# Patient Record
Sex: Female | Born: 1940 | Race: White | Hispanic: No | Marital: Married | Smoking: Former smoker
Health system: Southern US, Community
[De-identification: ages and names within clinical notes are randomized; demographics above are authoritative.]

## PROBLEM LIST (undated history)

## (undated) DIAGNOSIS — I10 Essential (primary) hypertension: Secondary | ICD-10-CM

## (undated) DIAGNOSIS — M8580 Other specified disorders of bone density and structure, unspecified site: Secondary | ICD-10-CM

## (undated) DIAGNOSIS — H25019 Cortical age-related cataract, unspecified eye: Secondary | ICD-10-CM

## (undated) DIAGNOSIS — K635 Polyp of colon: Secondary | ICD-10-CM

## (undated) DIAGNOSIS — Z972 Presence of dental prosthetic device (complete) (partial): Secondary | ICD-10-CM

## (undated) DIAGNOSIS — M1990 Unspecified osteoarthritis, unspecified site: Secondary | ICD-10-CM

## (undated) DIAGNOSIS — K219 Gastro-esophageal reflux disease without esophagitis: Secondary | ICD-10-CM

## (undated) DIAGNOSIS — E119 Type 2 diabetes mellitus without complications: Secondary | ICD-10-CM

## (undated) DIAGNOSIS — E785 Hyperlipidemia, unspecified: Secondary | ICD-10-CM

## (undated) DIAGNOSIS — C801 Malignant (primary) neoplasm, unspecified: Secondary | ICD-10-CM

## (undated) DIAGNOSIS — R7303 Prediabetes: Secondary | ICD-10-CM

## (undated) DIAGNOSIS — E039 Hypothyroidism, unspecified: Secondary | ICD-10-CM

## (undated) DIAGNOSIS — R011 Cardiac murmur, unspecified: Secondary | ICD-10-CM

## (undated) HISTORY — PX: COLONOSCOPY: SHX174

## (undated) HISTORY — PX: ABDOMINAL HYSTERECTOMY: SHX81

## (undated) HISTORY — PX: BREAST CYST ASPIRATION: SHX578

## (undated) HISTORY — PX: CRANIOPLASTY: SHX1407

---

## 2005-08-30 ENCOUNTER — Ambulatory Visit: Payer: Self-pay

## 2007-05-29 ENCOUNTER — Ambulatory Visit: Payer: Self-pay

## 2008-05-31 ENCOUNTER — Ambulatory Visit: Payer: Self-pay

## 2009-05-04 ENCOUNTER — Ambulatory Visit: Payer: Self-pay | Admitting: Family Medicine

## 2009-06-06 ENCOUNTER — Ambulatory Visit: Payer: Self-pay | Admitting: Obstetrics and Gynecology

## 2010-06-12 ENCOUNTER — Ambulatory Visit: Payer: Self-pay

## 2010-06-16 ENCOUNTER — Ambulatory Visit: Payer: Self-pay

## 2010-08-11 ENCOUNTER — Ambulatory Visit: Payer: Self-pay | Admitting: Internal Medicine

## 2010-08-23 ENCOUNTER — Ambulatory Visit: Payer: Self-pay | Admitting: Ophthalmology

## 2010-10-30 ENCOUNTER — Ambulatory Visit: Payer: Self-pay | Admitting: Gastroenterology

## 2010-10-30 HISTORY — PX: COLONOSCOPY: SHX174

## 2010-11-01 LAB — PATHOLOGY REPORT

## 2010-12-24 HISTORY — PX: CATARACT EXTRACTION: SUR2

## 2011-06-20 ENCOUNTER — Ambulatory Visit: Payer: Self-pay

## 2012-06-25 ENCOUNTER — Ambulatory Visit: Payer: Self-pay

## 2013-07-20 ENCOUNTER — Ambulatory Visit: Payer: Self-pay | Admitting: Obstetrics and Gynecology

## 2013-07-28 ENCOUNTER — Ambulatory Visit: Payer: Self-pay | Admitting: Obstetrics and Gynecology

## 2014-02-16 ENCOUNTER — Ambulatory Visit: Payer: Self-pay | Admitting: Surgery

## 2014-04-30 DIAGNOSIS — E039 Hypothyroidism, unspecified: Secondary | ICD-10-CM | POA: Insufficient documentation

## 2014-04-30 DIAGNOSIS — E7800 Pure hypercholesterolemia, unspecified: Secondary | ICD-10-CM | POA: Insufficient documentation

## 2014-04-30 DIAGNOSIS — E119 Type 2 diabetes mellitus without complications: Secondary | ICD-10-CM | POA: Insufficient documentation

## 2014-04-30 DIAGNOSIS — M8580 Other specified disorders of bone density and structure, unspecified site: Secondary | ICD-10-CM | POA: Insufficient documentation

## 2014-07-27 ENCOUNTER — Ambulatory Visit: Payer: Self-pay

## 2014-12-29 DIAGNOSIS — H2012 Chronic iridocyclitis, left eye: Secondary | ICD-10-CM | POA: Diagnosis not present

## 2015-03-16 DIAGNOSIS — M25511 Pain in right shoulder: Secondary | ICD-10-CM | POA: Diagnosis not present

## 2015-06-21 DIAGNOSIS — Z1231 Encounter for screening mammogram for malignant neoplasm of breast: Secondary | ICD-10-CM | POA: Diagnosis not present

## 2015-06-21 DIAGNOSIS — Z01411 Encounter for gynecological examination (general) (routine) with abnormal findings: Secondary | ICD-10-CM | POA: Diagnosis not present

## 2015-06-22 ENCOUNTER — Other Ambulatory Visit: Payer: Self-pay | Admitting: Obstetrics and Gynecology

## 2015-06-22 DIAGNOSIS — R921 Mammographic calcification found on diagnostic imaging of breast: Secondary | ICD-10-CM

## 2015-06-28 DIAGNOSIS — E780 Pure hypercholesterolemia: Secondary | ICD-10-CM | POA: Diagnosis not present

## 2015-06-28 DIAGNOSIS — E119 Type 2 diabetes mellitus without complications: Secondary | ICD-10-CM | POA: Diagnosis not present

## 2015-06-28 DIAGNOSIS — Z23 Encounter for immunization: Secondary | ICD-10-CM | POA: Diagnosis not present

## 2015-06-28 DIAGNOSIS — E039 Hypothyroidism, unspecified: Secondary | ICD-10-CM | POA: Diagnosis not present

## 2015-07-19 DIAGNOSIS — Z780 Asymptomatic menopausal state: Secondary | ICD-10-CM | POA: Diagnosis not present

## 2015-07-29 ENCOUNTER — Ambulatory Visit
Admission: RE | Admit: 2015-07-29 | Discharge: 2015-07-29 | Disposition: A | Discharge status: Home / Self Care | Payer: Commercial Managed Care - HMO | Source: Ambulatory Visit | Attending: Obstetrics and Gynecology | Admitting: Obstetrics and Gynecology

## 2015-07-29 ENCOUNTER — Ambulatory Visit: Payer: Medicare HMO

## 2015-07-29 DIAGNOSIS — R921 Mammographic calcification found on diagnostic imaging of breast: Secondary | ICD-10-CM | POA: Insufficient documentation

## 2015-08-19 DIAGNOSIS — R202 Paresthesia of skin: Secondary | ICD-10-CM | POA: Diagnosis not present

## 2015-08-19 DIAGNOSIS — L814 Other melanin hyperpigmentation: Secondary | ICD-10-CM | POA: Diagnosis not present

## 2015-08-19 DIAGNOSIS — D1801 Hemangioma of skin and subcutaneous tissue: Secondary | ICD-10-CM | POA: Diagnosis not present

## 2015-11-02 DIAGNOSIS — Z23 Encounter for immunization: Secondary | ICD-10-CM | POA: Diagnosis not present

## 2015-11-23 DIAGNOSIS — G8929 Other chronic pain: Secondary | ICD-10-CM | POA: Diagnosis not present

## 2015-11-23 DIAGNOSIS — M25511 Pain in right shoulder: Secondary | ICD-10-CM | POA: Diagnosis not present

## 2015-11-30 DIAGNOSIS — M7581 Other shoulder lesions, right shoulder: Secondary | ICD-10-CM | POA: Diagnosis not present

## 2015-12-29 DIAGNOSIS — E7800 Pure hypercholesterolemia, unspecified: Secondary | ICD-10-CM | POA: Diagnosis not present

## 2015-12-29 DIAGNOSIS — E119 Type 2 diabetes mellitus without complications: Secondary | ICD-10-CM | POA: Diagnosis not present

## 2015-12-29 DIAGNOSIS — E039 Hypothyroidism, unspecified: Secondary | ICD-10-CM | POA: Diagnosis not present

## 2016-01-17 ENCOUNTER — Other Ambulatory Visit: Payer: Self-pay | Admitting: Orthopedic Surgery

## 2016-01-17 DIAGNOSIS — G8929 Other chronic pain: Secondary | ICD-10-CM | POA: Diagnosis not present

## 2016-01-17 DIAGNOSIS — M25511 Pain in right shoulder: Principal | ICD-10-CM

## 2016-01-17 DIAGNOSIS — M67911 Unspecified disorder of synovium and tendon, right shoulder: Secondary | ICD-10-CM | POA: Diagnosis not present

## 2016-01-19 ENCOUNTER — Ambulatory Visit
Admission: RE | Admit: 2016-01-19 | Discharge: 2016-01-19 | Disposition: A | Discharge status: Home / Self Care | Payer: Commercial Managed Care - HMO | Source: Ambulatory Visit | Attending: Orthopedic Surgery | Admitting: Orthopedic Surgery

## 2016-01-19 DIAGNOSIS — M19011 Primary osteoarthritis, right shoulder: Secondary | ICD-10-CM | POA: Diagnosis not present

## 2016-01-19 DIAGNOSIS — M75121 Complete rotator cuff tear or rupture of right shoulder, not specified as traumatic: Secondary | ICD-10-CM | POA: Diagnosis not present

## 2016-01-19 DIAGNOSIS — M25511 Pain in right shoulder: Secondary | ICD-10-CM | POA: Diagnosis not present

## 2016-01-19 DIAGNOSIS — M67911 Unspecified disorder of synovium and tendon, right shoulder: Secondary | ICD-10-CM | POA: Diagnosis not present

## 2016-01-19 DIAGNOSIS — G8929 Other chronic pain: Secondary | ICD-10-CM | POA: Insufficient documentation

## 2016-01-19 DIAGNOSIS — S43431A Superior glenoid labrum lesion of right shoulder, initial encounter: Secondary | ICD-10-CM | POA: Insufficient documentation

## 2016-01-23 DIAGNOSIS — M25611 Stiffness of right shoulder, not elsewhere classified: Secondary | ICD-10-CM | POA: Diagnosis not present

## 2016-01-23 DIAGNOSIS — G8929 Other chronic pain: Secondary | ICD-10-CM | POA: Diagnosis not present

## 2016-01-23 DIAGNOSIS — M6281 Muscle weakness (generalized): Secondary | ICD-10-CM | POA: Diagnosis not present

## 2016-01-23 DIAGNOSIS — M25511 Pain in right shoulder: Secondary | ICD-10-CM | POA: Diagnosis not present

## 2016-02-08 DIAGNOSIS — M6281 Muscle weakness (generalized): Secondary | ICD-10-CM | POA: Diagnosis not present

## 2016-02-08 DIAGNOSIS — G8929 Other chronic pain: Secondary | ICD-10-CM | POA: Diagnosis not present

## 2016-02-08 DIAGNOSIS — M25511 Pain in right shoulder: Secondary | ICD-10-CM | POA: Diagnosis not present

## 2016-02-08 DIAGNOSIS — M25611 Stiffness of right shoulder, not elsewhere classified: Secondary | ICD-10-CM | POA: Diagnosis not present

## 2016-02-10 DIAGNOSIS — M6281 Muscle weakness (generalized): Secondary | ICD-10-CM | POA: Diagnosis not present

## 2016-02-10 DIAGNOSIS — G8929 Other chronic pain: Secondary | ICD-10-CM | POA: Diagnosis not present

## 2016-02-10 DIAGNOSIS — M25611 Stiffness of right shoulder, not elsewhere classified: Secondary | ICD-10-CM | POA: Diagnosis not present

## 2016-02-10 DIAGNOSIS — M25511 Pain in right shoulder: Secondary | ICD-10-CM | POA: Diagnosis not present

## 2016-02-13 DIAGNOSIS — M25511 Pain in right shoulder: Secondary | ICD-10-CM | POA: Diagnosis not present

## 2016-02-13 DIAGNOSIS — M25611 Stiffness of right shoulder, not elsewhere classified: Secondary | ICD-10-CM | POA: Diagnosis not present

## 2016-02-13 DIAGNOSIS — G8929 Other chronic pain: Secondary | ICD-10-CM | POA: Diagnosis not present

## 2016-02-13 DIAGNOSIS — M6281 Muscle weakness (generalized): Secondary | ICD-10-CM | POA: Diagnosis not present

## 2016-02-15 DIAGNOSIS — M75121 Complete rotator cuff tear or rupture of right shoulder, not specified as traumatic: Secondary | ICD-10-CM | POA: Diagnosis not present

## 2016-02-15 DIAGNOSIS — M7581 Other shoulder lesions, right shoulder: Secondary | ICD-10-CM | POA: Diagnosis not present

## 2016-02-15 DIAGNOSIS — S4991XD Unspecified injury of right shoulder and upper arm, subsequent encounter: Secondary | ICD-10-CM | POA: Diagnosis not present

## 2016-02-17 ENCOUNTER — Encounter: Payer: Self-pay | Admitting: *Deleted

## 2016-02-22 ENCOUNTER — Encounter: Payer: Self-pay | Admitting: Anesthesiology

## 2016-02-22 ENCOUNTER — Ambulatory Visit
Admission: RE | Admit: 2016-02-22 | Discharge: 2016-02-22 | Disposition: A | Discharge status: Home / Self Care | Payer: Commercial Managed Care - HMO | Source: Ambulatory Visit | Attending: Surgery | Admitting: Surgery

## 2016-02-22 ENCOUNTER — Ambulatory Visit: Payer: Commercial Managed Care - HMO | Admitting: Anesthesiology

## 2016-02-22 ENCOUNTER — Encounter
Admission: RE | Disposition: A | Discharge status: Home / Self Care | Payer: Self-pay | Source: Ambulatory Visit | Attending: Surgery

## 2016-02-22 DIAGNOSIS — Y9389 Activity, other specified: Secondary | ICD-10-CM | POA: Insufficient documentation

## 2016-02-22 DIAGNOSIS — M19011 Primary osteoarthritis, right shoulder: Secondary | ICD-10-CM | POA: Insufficient documentation

## 2016-02-22 DIAGNOSIS — E039 Hypothyroidism, unspecified: Secondary | ICD-10-CM | POA: Insufficient documentation

## 2016-02-22 DIAGNOSIS — S43431A Superior glenoid labrum lesion of right shoulder, initial encounter: Secondary | ICD-10-CM | POA: Insufficient documentation

## 2016-02-22 DIAGNOSIS — X58XXXA Exposure to other specified factors, initial encounter: Secondary | ICD-10-CM | POA: Insufficient documentation

## 2016-02-22 DIAGNOSIS — E785 Hyperlipidemia, unspecified: Secondary | ICD-10-CM | POA: Diagnosis not present

## 2016-02-22 DIAGNOSIS — E119 Type 2 diabetes mellitus without complications: Secondary | ICD-10-CM | POA: Diagnosis not present

## 2016-02-22 DIAGNOSIS — Z8249 Family history of ischemic heart disease and other diseases of the circulatory system: Secondary | ICD-10-CM | POA: Insufficient documentation

## 2016-02-22 DIAGNOSIS — M8580 Other specified disorders of bone density and structure, unspecified site: Secondary | ICD-10-CM | POA: Insufficient documentation

## 2016-02-22 DIAGNOSIS — M75121 Complete rotator cuff tear or rupture of right shoulder, not specified as traumatic: Secondary | ICD-10-CM | POA: Diagnosis not present

## 2016-02-22 DIAGNOSIS — Z79899 Other long term (current) drug therapy: Secondary | ICD-10-CM | POA: Insufficient documentation

## 2016-02-22 DIAGNOSIS — Z833 Family history of diabetes mellitus: Secondary | ICD-10-CM | POA: Diagnosis not present

## 2016-02-22 DIAGNOSIS — Z90710 Acquired absence of both cervix and uterus: Secondary | ICD-10-CM | POA: Diagnosis not present

## 2016-02-22 DIAGNOSIS — M7581 Other shoulder lesions, right shoulder: Secondary | ICD-10-CM | POA: Diagnosis not present

## 2016-02-22 DIAGNOSIS — S43439A Superior glenoid labrum lesion of unspecified shoulder, initial encounter: Secondary | ICD-10-CM | POA: Diagnosis present

## 2016-02-22 DIAGNOSIS — M24111 Other articular cartilage disorders, right shoulder: Secondary | ICD-10-CM | POA: Diagnosis not present

## 2016-02-22 DIAGNOSIS — M7591 Shoulder lesion, unspecified, right shoulder: Secondary | ICD-10-CM | POA: Diagnosis not present

## 2016-02-22 DIAGNOSIS — Y929 Unspecified place or not applicable: Secondary | ICD-10-CM | POA: Insufficient documentation

## 2016-02-22 DIAGNOSIS — Z811 Family history of alcohol abuse and dependence: Secondary | ICD-10-CM | POA: Diagnosis not present

## 2016-02-22 DIAGNOSIS — Z87891 Personal history of nicotine dependence: Secondary | ICD-10-CM | POA: Insufficient documentation

## 2016-02-22 DIAGNOSIS — M7521 Bicipital tendinitis, right shoulder: Secondary | ICD-10-CM | POA: Diagnosis not present

## 2016-02-22 DIAGNOSIS — M7541 Impingement syndrome of right shoulder: Secondary | ICD-10-CM | POA: Insufficient documentation

## 2016-02-22 HISTORY — DX: Prediabetes: R73.03

## 2016-02-22 HISTORY — DX: Presence of dental prosthetic device (complete) (partial): Z97.2

## 2016-02-22 HISTORY — DX: Other specified disorders of bone density and structure, unspecified site: M85.80

## 2016-02-22 HISTORY — PX: SHOULDER ARTHROSCOPY: SHX128

## 2016-02-22 HISTORY — DX: Hyperlipidemia, unspecified: E78.5

## 2016-02-22 HISTORY — DX: Hypothyroidism, unspecified: E03.9

## 2016-02-22 HISTORY — DX: Cardiac murmur, unspecified: R01.1

## 2016-02-22 SURGERY — ARTHROSCOPY, SHOULDER
Anesthesia: Regional | Site: Shoulder | Laterality: Right | Wound class: Clean

## 2016-02-22 MED ORDER — POTASSIUM CHLORIDE IN NACL 20-0.9 MEQ/L-% IV SOLN: INTRAVENOUS | Status: DC

## 2016-02-22 MED ORDER — METOCLOPRAMIDE HCL 5 MG/ML IJ SOLN: 5.0000 mg | Freq: Three times a day (TID) | INTRAMUSCULAR | Status: DC | PRN

## 2016-02-22 MED ORDER — LACTATED RINGERS IV SOLN
INTRAVENOUS | Status: DC | PRN
  Administered 2016-02-22: 13:00:00

## 2016-02-22 MED ORDER — LIDOCAINE HCL (CARDIAC) 20 MG/ML IV SOLN
INTRAVENOUS | Status: DC | PRN
  Administered 2016-02-22: 50 mg via INTRATRACHEAL

## 2016-02-22 MED ORDER — DEXAMETHASONE SODIUM PHOSPHATE 4 MG/ML IJ SOLN
INTRAMUSCULAR | Status: DC | PRN
  Administered 2016-02-22: 8 mg via INTRAVENOUS

## 2016-02-22 MED ORDER — ONDANSETRON HCL 4 MG PO TABS: 4.0000 mg | ORAL_TABLET | Freq: Four times a day (QID) | ORAL | Status: DC | PRN

## 2016-02-22 MED ORDER — BUPIVACAINE-EPINEPHRINE (PF) 0.5% -1:200000 IJ SOLN
INTRAMUSCULAR | Status: DC | PRN
  Administered 2016-02-22: 21 mL

## 2016-02-22 MED ORDER — OXYCODONE HCL 5 MG PO TABS: 5.0000 mg | ORAL_TABLET | ORAL | Status: DC | PRN

## 2016-02-22 MED ORDER — OXYCODONE HCL 5 MG PO TABS: 5.0000 mg | ORAL_TABLET | Freq: Once | ORAL | Status: DC | PRN

## 2016-02-22 MED ORDER — PROMETHAZINE HCL 25 MG/ML IJ SOLN: 6.2500 mg | INTRAMUSCULAR | Status: DC | PRN

## 2016-02-22 MED ORDER — ACETAMINOPHEN 325 MG PO TABS
650.0000 mg | ORAL_TABLET | Freq: Once | ORAL | Status: AC | PRN
  Administered 2016-02-22: 650 mg via ORAL

## 2016-02-22 MED ORDER — ONDANSETRON HCL 4 MG/2ML IJ SOLN
INTRAMUSCULAR | Status: DC | PRN
Start: 2016-02-22 — End: 2016-02-22

## 2016-02-22 MED ORDER — FENTANYL CITRATE (PF) 100 MCG/2ML IJ SOLN: 25.0000 ug | INTRAMUSCULAR | Status: DC | PRN

## 2016-02-22 MED ORDER — MIDAZOLAM HCL 2 MG/2ML IJ SOLN
INTRAMUSCULAR | Status: DC | PRN
  Administered 2016-02-22: 2 mg via INTRAVENOUS

## 2016-02-22 MED ORDER — ONDANSETRON HCL 4 MG/2ML IJ SOLN
INTRAMUSCULAR | Status: DC | PRN
  Administered 2016-02-22: 4 mg via INTRAVENOUS

## 2016-02-22 MED ORDER — LACTATED RINGERS IV SOLN
INTRAVENOUS | Status: DC
  Administered 2016-02-22: 10:00:00 via INTRAVENOUS

## 2016-02-22 MED ORDER — METOCLOPRAMIDE HCL 5 MG PO TABS: 5.0000 mg | ORAL_TABLET | Freq: Three times a day (TID) | ORAL | Status: DC | PRN

## 2016-02-22 MED ORDER — CEFAZOLIN SODIUM-DEXTROSE 2-3 GM-% IV SOLR
2.0000 g | Freq: Once | INTRAVENOUS | Status: AC
  Administered 2016-02-22: 2 g via INTRAVENOUS

## 2016-02-22 MED ORDER — FENTANYL CITRATE (PF) 100 MCG/2ML IJ SOLN
INTRAMUSCULAR | Status: DC | PRN
  Administered 2016-02-22 (×3): 50 ug via INTRAVENOUS

## 2016-02-22 MED ORDER — OXYCODONE HCL 5 MG/5ML PO SOLN: 5.0000 mg | Freq: Once | ORAL | Status: DC | PRN

## 2016-02-22 MED ORDER — PROPOFOL 10 MG/ML IV BOLUS
INTRAVENOUS | Status: DC | PRN
  Administered 2016-02-22: 150 mg via INTRAVENOUS

## 2016-02-22 MED ORDER — ONDANSETRON HCL 4 MG/2ML IJ SOLN: 4.0000 mg | Freq: Four times a day (QID) | INTRAMUSCULAR | Status: DC | PRN

## 2016-02-22 MED ORDER — ROPIVACAINE HCL 5 MG/ML IJ SOLN
INTRAMUSCULAR | Status: DC | PRN
  Administered 2016-02-22: 200 mg via PERINEURAL

## 2016-02-22 SURGICAL SUPPLY — 37 items
ANCHOR JUGGERKNOT WTAP NDL 2.9 (Anchor) ×4 IMPLANT
ANCHOR SUT W/ ORTHOCORD (Anchor) ×2 IMPLANT
BIT DRILL JUGRKNT W/NDL BIT2.9 (DRILL) ×1 IMPLANT
BLADE FULL RADIUS 3.5 (BLADE) ×2 IMPLANT
BUR ACROMIONIZER 4.0 (BURR) ×2 IMPLANT
CHLORAPREP W/TINT 26ML (MISCELLANEOUS) ×4 IMPLANT
COVER LIGHT HANDLE UNIVERSAL (MISCELLANEOUS) ×4 IMPLANT
COVER MAYO STAND STRL (DRAPES) ×2 IMPLANT
DRAPE IMP U-DRAPE 54X76 (DRAPES) ×4 IMPLANT
DRILL JUGGERKNOT W/NDL BIT 2.9 (DRILL) ×2
GAUZE PETRO XEROFOAM 1X8 (MISCELLANEOUS) ×2 IMPLANT
GAUZE SPONGE 4X4 12PLY STRL (GAUZE/BANDAGES/DRESSINGS) ×2 IMPLANT
GLOVE BIO SURGEON STRL SZ8 (GLOVE) ×4 IMPLANT
GLOVE INDICATOR 8.0 STRL GRN (GLOVE) ×2 IMPLANT
GOWN STRL REUS W/ TWL LRG LVL3 (GOWN DISPOSABLE) ×1 IMPLANT
GOWN STRL REUS W/ TWL XL LVL3 (GOWN DISPOSABLE) ×1 IMPLANT
GOWN STRL REUS W/TWL LRG LVL3 (GOWN DISPOSABLE) ×1
GOWN STRL REUS W/TWL XL LVL3 (GOWN DISPOSABLE) ×1
IMPL ANCHOR PEEK 4.5MM (Anchor) ×1 IMPLANT
IMPLANT ANCHOR PEEK 4.5MM (Anchor) ×2 IMPLANT
IV LACTATED RINGER IRRG 3000ML (IV SOLUTION) ×2
IV LR IRRIG 3000ML ARTHROMATIC (IV SOLUTION) ×2 IMPLANT
KIT CANNULA 8X76-LX IN CANNULA (CANNULA) ×2 IMPLANT
KIT ROOM TURNOVER OR (KITS) ×2 IMPLANT
MANIFOLD 4PT FOR NEPTUNE1 (MISCELLANEOUS) ×2 IMPLANT
MAT BLUE FLOOR 46X72 FLO (MISCELLANEOUS) ×2 IMPLANT
NEEDLE HYPO 21X1.5 SAFETY (NEEDLE) ×2 IMPLANT
PACK ARTHROSCOPY SHOULDER (MISCELLANEOUS) ×2 IMPLANT
PAD GROUND ADULT SPLIT (MISCELLANEOUS) IMPLANT
STAPLER SKIN PROX 35W (STAPLE) ×2 IMPLANT
STRAP BODY AND KNEE 60X3 (MISCELLANEOUS) ×4 IMPLANT
SUT ETHIBOND 0 MO6 C/R (SUTURE) ×2 IMPLANT
SUT VIC AB 2-0 CT1 27 (SUTURE) ×2
SUT VIC AB 2-0 CT1 TAPERPNT 27 (SUTURE) ×2 IMPLANT
TAPE MICROFOAM 4IN (TAPE) ×2 IMPLANT
TUBING ARTHRO INFLOW-ONLY STRL (TUBING) ×2 IMPLANT
WAND HAND CNTRL MULTIVAC 90 (MISCELLANEOUS) ×2 IMPLANT

## 2016-02-22 NOTE — Transfer of Care (Signed)
Immediate Anesthesia Transfer of Care Note  Patient: Gloria Eaton  Procedure(s) Performed: Procedure(s): ARTHROSCOPY SHOULDER WITH DEBRIDEMENT, DECOMPRESSION, POSSIBLE SLAP REPAIR, ROTATOR CUFF REPAIR AND BICEPS TENODESIS (Right)  Patient Location: PACU  Anesthesia Type: Regional, General LMA  Level of Consciousness: awake, alert  and patient cooperative  Airway and Oxygen Therapy: Patient Spontanous Breathing and Patient connected to supplemental oxygen  Post-op Assessment: Post-op Vital signs reviewed, Patient's Cardiovascular Status Stable, Respiratory Function Stable, Patent Airway and No signs of Nausea or vomiting  Post-op Vital Signs: Reviewed and stable  Complications: No apparent anesthesia complications

## 2016-02-22 NOTE — H&P (Signed)
Paper H&P to be scanned into permanent record. H&P reviewed. No changes. 

## 2016-02-22 NOTE — Anesthesia Postprocedure Evaluation (Signed)
Anesthesia Post Note  Patient: Gloria Eaton  Procedure(s) Performed: Procedure(s) (LRB): Extensive arthroscopic debridement, arthroscopic subscapularis tendon tear, arthroscopic subacromial decompression, mini-open rotator cuff repair, and mini-open biceps tenodesis, right shoulder.  (Right)  Patient location during evaluation: PACU Anesthesia Type: General Level of consciousness: awake and alert Pain management: pain level controlled Vital Signs Assessment: post-procedure vital signs reviewed and stable Respiratory status: spontaneous breathing, nonlabored ventilation, respiratory function stable and patient connected to nasal cannula oxygen Cardiovascular status: blood pressure returned to baseline and stable Postop Assessment: no signs of nausea or vomiting Anesthetic complications: no    Alisa Graff

## 2016-02-22 NOTE — Anesthesia Procedure Notes (Addendum)
Anesthesia Regional Block:  Interscalene brachial plexus block  Pre-Anesthetic Checklist: ,, timeout performed, Correct Patient, Correct Site, Correct Laterality, Correct Procedure, Correct Position, site marked, Risks and benefits discussed,  Surgical consent,  Pre-op evaluation,  At surgeon's request and post-op pain management   Prep: chloraprep       Needles:  Injection technique: Single-shot  Needle Type: Stimiplex     Needle Length: 5cm 5 cm Needle Gauge: 22 and 22 G    Additional Needles:  Procedures: ultrasound guided (picture in chart)  Motor weakness within 5 minutes. Interscalene brachial plexus block Narrative:  Start time: 02/22/2016 9:57 AM End time: 02/22/2016 10:03 AM Injection made incrementally with aspirations every 5 mL.  Performed by: Personally  Anesthesiologist: BEACH, RACHEL B  Additional Notes: Functioning IV was confirmed and monitors applied. Ultrasound guidance: relevant anatomy identified, needle position confirmed, local anesthetic spread visualized around nerve(s)., vascular puncture avoided.  Image printed for medical record.  Negative aspiration and no paresthesias; incremental administration of local anesthetic. The patient tolerated the procedure well. Vitals signes recorded in RN notes.   Procedure Name: LMA Insertion Date/Time: 02/22/2016 11:37 AM Performed by: Londell Moh Pre-anesthesia Checklist: Patient identified, Emergency Drugs available, Suction available, Timeout performed and Patient being monitored Patient Re-evaluated:Patient Re-evaluated prior to inductionOxygen Delivery Method: Circle system utilized Preoxygenation: Pre-oxygenation with 100% oxygen Intubation Type: IV induction LMA: LMA inserted LMA Size: 4.0 Number of attempts: 1 Placement Confirmation: positive ETCO2 and breath sounds checked- equal and bilateral Tube secured with: Tape

## 2016-02-22 NOTE — Op Note (Signed)
02/22/2016  1:40 PM  Patient:   Gloria Eaton  Pre-Op Diagnosis:   Impingement/tendinopathy with rotator cuff tear, degenerative SLAP tear, and biceps tendinopathy, right shoulder.  Postoperative diagnosis: Impingement/tendinopathy with full thickness supraspinatus tendon tear, partial thickness subscapularis tendon tear, Type I degenerative SLAP tear, and biceps tendinopathy, right shoulder.  Procedure: Extensive arthroscopic debridement, arthroscopic subscapularis tendon tear, arthroscopic subacromial decompression, mini-open rotator cuff repair, and mini-open biceps tenodesis, right shoulder.  Anesthesia: General LMA with interscalene block placed preoperatively by the anesthesiologist.  Surgeon:   Pascal Lux, MD  Assistant:   None  Findings: As above. There was a type I degenerative tear of the superior portion of the labrum without frank detachment of the labrum from the glenoid rim. The subscapularis tendon tear involved the insertional fibers of the superior 10% of the tendon, while the supraspinatus tendon tear involved the anterior insertional fibers of the supraspinatus. The articular surfaces of the glenoid and humerus both were in excellent condition.  Complications: None  Fluids:   900 cc  Estimated blood loss: 15 cc  Tourniquet time: None  Drains: None  Closure: Staples   Brief clinical note: The patient is a 75 year old female with a history of right shoulder pain.. The patient's symptoms have progressed despite medications, activity modification, etc. The patient's history and examination are consistent with impingement/tendinopathy with a rotator cuff tear. These findings were confirmed by MRI scan. The patient presents at this time for definitive management of these shoulder symptoms.  Procedure: The patient underwent placement of an interscalene block by the anesthesiologist in the preoperative holding area before she was brought into  the operating room and lain in the supine position. The patient then underwent general laryngeal mask anesthesia before being repositioned in the beach chair position using the beach chair positioner. The right shoulder and upper extremity were prepped with ChloraPrep solution before being draped sterilely. Preoperative antibiotics were administered. A timeout was performed to confirm the proper surgical site before the expected portal sites and incision site were injected with 0.5% Sensorcaine with epinephrine. A posterior portal was created and the glenohumeral joint thoroughly inspected with the findings as described above. An anterior portal was created using an outside-in technique. The labrum and rotator cuff were further probed, again confirming the above-noted findings. Areas of extensive synovitis anteriorly, superiorly, and posteriorly all were debrided back to stable margins using the full-radius resector, as was the degenerative torn portion of the superior labrum. Finally, the torn margins of the supraspinatus and subscapularis tendon tears also were debrided back to stable margins using the full-radius resector. The ArthroCare wand was inserted and used to release the biceps tendon from its labral attachment before contouring the labrum stump. It also was used obtain hemostasis. A separate superolateral portal site was placed using an outside-in technique before the superior portion of the subscapularis tendon was reapproximated using a single Mitek BioKnotless anchor. The repair was noted to be stable with passive external rotation, as well as with probing. The instruments were removed from the joint after suctioning the excess fluid.  The camera was repositioned through the posterior portal into the subacromial space. A separate lateral portal was created using an outside-in technique. The 3.5 mm full-radius resector was introduced and used to perform a subtotal bursectomy. The ArthroCare wand was  then inserted and used to remove the periosteal tissue off the undersurface of the anterior third of the acromion as well as to recess the coracoacromial ligament from its attachment along  the anterior and lateral margins of the acromion. The 4.0 mm acromionizing bur was introduced and used to complete the decompression by removing the undersurface of the anterior third of the acromion. The full radius resector was reintroduced to remove any residual bony debris before the ArthroCare wand was reintroduced to obtain hemostasis. The instruments were then removed from the subacromial space after suctioning the excess fluid.  An approximately 4-5 cm incision was made over the anterolateral aspect of the shoulder beginning at the anterolateral corner of the acromion and extending distally in line with the bicipital groove. This incision was carried down through the subcutaneous tissues to expose the deltoid fascia. The raphae between the anterior and middle thirds was identified and this plane developed to provide access into the subacromial space. Additional bursal tissues were debrided sharply using Metzenbaum scissors. The rotator cuff tear was readily identified. The margins were debrided sharply with a #15 blade and the exposed greater tuberosity roughened with a rongeur. The tear was repaired using a single Biomet 2.9 mm JuggerKnot anchor. Several of these sutures were then brought back laterally and secured using a single Biomet [employer] anchor to create a two-layer closure. A single #0 Ethibond interrupted suture was placed in a side-to-side fashion to optimize the repair. An apparent watertight closure was obtained.  The bicipital groove was identified by palpation and opened for 1-1.5 cm. The biceps tendon stump was retrieved through this defect. The floor of the bicipital groove was roughened with a curet before another Biomet 2.9 mm JuggerKnot anchor was inserted. Both sets of sutures were passed  through the biceps tendon and tied securely to effect the tenodesis. The bicipital sheath was reapproximated using two #0 Ethibond interrupted sutures, incorporating the biceps tendon to further reinforce the tenodesis.  The wound was copiously irrigated with sterile saline solution before the deltoid raphae was reapproximated using 2-0 Vicryl interrupted sutures. The subcutaneous tissues were closed in two layers using 2-0 Vicryl interrupted sutures before the skin was closed using staples. The portal sites also were closed using staples. A sterile bulky dressing was applied to the shoulder before the arm was placed into a shoulder immobilizer. The patient was then awakened, extubated, and returned to the recovery room in satisfactory condition after tolerating the procedure well.

## 2016-02-22 NOTE — Progress Notes (Signed)
[place] ANMD with right, supraclavicular block. Side rails up, monitors on throughout procedure. See vital signs in flow sheet. Tolerated Procedure well.

## 2016-02-22 NOTE — Anesthesia Preprocedure Evaluation (Addendum)
Anesthesia Evaluation  Patient identified by MRN, date of birth, ID band Patient awake    Reviewed: Allergy & Precautions, H&P , NPO status , Patient's Chart, lab work & pertinent test results, reviewed documented beta blocker date and time   Airway Mallampati: II  TM Distance: >3 FB Neck ROM: full    Dental  (+) Upper Dentures, Lower Dentures   Pulmonary neg pulmonary ROS, former smoker,    Pulmonary exam normal breath sounds clear to auscultation       Cardiovascular Exercise Tolerance: Good negative cardio ROS   Rhythm:regular Rate:Normal     Neuro/Psych negative neurological ROS  negative psych ROS   GI/Hepatic negative GI ROS, Neg liver ROS,   Endo/Other  Hypothyroidism   Renal/GU negative Renal ROS  negative genitourinary   Musculoskeletal   Abdominal   Peds  Hematology negative hematology ROS (+)   Anesthesia Other Findings   Reproductive/Obstetrics negative OB ROS                            Anesthesia Physical Anesthesia Plan  ASA: II  Anesthesia Plan: Regional and General LMA   Post-op Pain Management: GA combined w/ Regional for post-op pain   Induction:   Airway Management Planned:   Additional Equipment:   Intra-op Plan:   Post-operative Plan:   Informed Consent: I have reviewed the patients History and Physical, chart, labs and discussed the procedure including the risks, benefits and alternatives for the proposed anesthesia with the patient or authorized representative who has indicated his/her understanding and acceptance.   Dental Advisory Given  Plan Discussed with: CRNA  Anesthesia Plan Comments:        Anesthesia Quick Evaluation

## 2016-02-22 NOTE — Discharge Instructions (Signed)
General Anesthesia, Adult, Care After Refer to this sheet in the next few weeks. These instructions provide you with information on caring for yourself after your procedure. Your health care provider may also give you more specific instructions. Your treatment has been planned according to current medical practices, but problems sometimes occur. Call your health care provider if you have any problems or questions after your procedure. WHAT TO EXPECT AFTER THE PROCEDURE After the procedure, it is typical to experience:  Sleepiness.  Nausea and vomiting. HOME CARE INSTRUCTIONS  For the first 24 hours after general anesthesia:  Have a responsible person with you.  Do not drive a car. If you are alone, do not take public transportation.  Do not drink alcohol.  Do not take medicine that has not been prescribed by your health care provider.  Do not sign important papers or make important decisions.  You may resume a normal diet and activities as directed by your health care provider.  Change bandages (dressings) as directed.  If you have questions or problems that seem related to general anesthesia, call the hospital and ask for the anesthetist or anesthesiologist on call. SEEK MEDICAL CARE IF:  You have nausea and vomiting that continue the day after anesthesia.  You develop a rash. SEEK IMMEDIATE MEDICAL CARE IF:   You have difficulty breathing.  You have chest pain.  You have any allergic problems.   This information is not intended to replace advice given to you by your health care provider. Make sure you discuss any questions you have with your health care provider.   Document Released: 03/18/2001 Document Revised: 12/31/2014 Document Reviewed: 04/09/2012 Elsevier Interactive Patient Education 2016 [employer].  Keep dressing dry and intact.  May shower after dressing changed on post-op day #4 (Sunday).  Cover staples with Band-Aids after drying off. Apply ice  frequently to shoulder. Resume naproxen twice daily with food. Take pain medication as prescribed as needed. May supplement these medications with extra strength Tylenol if necessary. Keep shoulder immobilizer on at all times except may remove for bathing purposes. Follow-up in 10-14 days or as scheduled.

## 2016-02-23 ENCOUNTER — Encounter: Payer: Self-pay | Admitting: Surgery

## 2016-02-28 DIAGNOSIS — M25511 Pain in right shoulder: Secondary | ICD-10-CM | POA: Diagnosis not present

## 2016-02-28 DIAGNOSIS — Z9889 Other specified postprocedural states: Secondary | ICD-10-CM | POA: Diagnosis not present

## 2016-02-28 DIAGNOSIS — M6281 Muscle weakness (generalized): Secondary | ICD-10-CM | POA: Diagnosis not present

## 2016-02-28 DIAGNOSIS — M25611 Stiffness of right shoulder, not elsewhere classified: Secondary | ICD-10-CM | POA: Diagnosis not present

## 2016-03-06 DIAGNOSIS — M25611 Stiffness of right shoulder, not elsewhere classified: Secondary | ICD-10-CM | POA: Diagnosis not present

## 2016-03-06 DIAGNOSIS — Z9889 Other specified postprocedural states: Secondary | ICD-10-CM | POA: Diagnosis not present

## 2016-03-06 DIAGNOSIS — M25511 Pain in right shoulder: Secondary | ICD-10-CM | POA: Diagnosis not present

## 2016-03-08 DIAGNOSIS — M25611 Stiffness of right shoulder, not elsewhere classified: Secondary | ICD-10-CM | POA: Diagnosis not present

## 2016-03-08 DIAGNOSIS — M25511 Pain in right shoulder: Secondary | ICD-10-CM | POA: Diagnosis not present

## 2016-03-12 DIAGNOSIS — M6281 Muscle weakness (generalized): Secondary | ICD-10-CM | POA: Diagnosis not present

## 2016-03-12 DIAGNOSIS — M25511 Pain in right shoulder: Secondary | ICD-10-CM | POA: Diagnosis not present

## 2016-03-14 DIAGNOSIS — M25611 Stiffness of right shoulder, not elsewhere classified: Secondary | ICD-10-CM | POA: Diagnosis not present

## 2016-03-14 DIAGNOSIS — M75121 Complete rotator cuff tear or rupture of right shoulder, not specified as traumatic: Secondary | ICD-10-CM | POA: Diagnosis not present

## 2016-03-14 DIAGNOSIS — M6281 Muscle weakness (generalized): Secondary | ICD-10-CM | POA: Diagnosis not present

## 2016-03-14 DIAGNOSIS — M25511 Pain in right shoulder: Secondary | ICD-10-CM | POA: Diagnosis not present

## 2016-03-19 DIAGNOSIS — M25511 Pain in right shoulder: Secondary | ICD-10-CM | POA: Diagnosis not present

## 2016-03-19 DIAGNOSIS — M25611 Stiffness of right shoulder, not elsewhere classified: Secondary | ICD-10-CM | POA: Diagnosis not present

## 2016-03-19 DIAGNOSIS — Z9889 Other specified postprocedural states: Secondary | ICD-10-CM | POA: Diagnosis not present

## 2016-03-21 DIAGNOSIS — Z9889 Other specified postprocedural states: Secondary | ICD-10-CM | POA: Diagnosis not present

## 2016-03-21 DIAGNOSIS — M25511 Pain in right shoulder: Secondary | ICD-10-CM | POA: Diagnosis not present

## 2016-03-21 DIAGNOSIS — M25611 Stiffness of right shoulder, not elsewhere classified: Secondary | ICD-10-CM | POA: Diagnosis not present

## 2016-04-02 DIAGNOSIS — M25511 Pain in right shoulder: Secondary | ICD-10-CM | POA: Diagnosis not present

## 2016-04-02 DIAGNOSIS — Z9889 Other specified postprocedural states: Secondary | ICD-10-CM | POA: Diagnosis not present

## 2016-04-02 DIAGNOSIS — M25611 Stiffness of right shoulder, not elsewhere classified: Secondary | ICD-10-CM | POA: Diagnosis not present

## 2016-04-02 DIAGNOSIS — M6281 Muscle weakness (generalized): Secondary | ICD-10-CM | POA: Diagnosis not present

## 2016-04-04 DIAGNOSIS — M6281 Muscle weakness (generalized): Secondary | ICD-10-CM | POA: Diagnosis not present

## 2016-04-04 DIAGNOSIS — M25511 Pain in right shoulder: Secondary | ICD-10-CM | POA: Diagnosis not present

## 2016-04-04 DIAGNOSIS — M25611 Stiffness of right shoulder, not elsewhere classified: Secondary | ICD-10-CM | POA: Diagnosis not present

## 2016-04-04 DIAGNOSIS — Z9889 Other specified postprocedural states: Secondary | ICD-10-CM | POA: Diagnosis not present

## 2016-04-10 DIAGNOSIS — M25511 Pain in right shoulder: Secondary | ICD-10-CM | POA: Diagnosis not present

## 2016-04-10 DIAGNOSIS — Z9889 Other specified postprocedural states: Secondary | ICD-10-CM | POA: Diagnosis not present

## 2016-04-10 DIAGNOSIS — M25611 Stiffness of right shoulder, not elsewhere classified: Secondary | ICD-10-CM | POA: Diagnosis not present

## 2016-04-10 DIAGNOSIS — M6281 Muscle weakness (generalized): Secondary | ICD-10-CM | POA: Diagnosis not present

## 2016-04-12 DIAGNOSIS — Z9889 Other specified postprocedural states: Secondary | ICD-10-CM | POA: Diagnosis not present

## 2016-04-12 DIAGNOSIS — M25511 Pain in right shoulder: Secondary | ICD-10-CM | POA: Diagnosis not present

## 2016-04-12 DIAGNOSIS — M25611 Stiffness of right shoulder, not elsewhere classified: Secondary | ICD-10-CM | POA: Diagnosis not present

## 2016-04-12 DIAGNOSIS — M6281 Muscle weakness (generalized): Secondary | ICD-10-CM | POA: Diagnosis not present

## 2016-04-17 DIAGNOSIS — M6281 Muscle weakness (generalized): Secondary | ICD-10-CM | POA: Diagnosis not present

## 2016-04-17 DIAGNOSIS — M25611 Stiffness of right shoulder, not elsewhere classified: Secondary | ICD-10-CM | POA: Diagnosis not present

## 2016-04-17 DIAGNOSIS — M25511 Pain in right shoulder: Secondary | ICD-10-CM | POA: Diagnosis not present

## 2016-04-17 DIAGNOSIS — Z9889 Other specified postprocedural states: Secondary | ICD-10-CM | POA: Diagnosis not present

## 2016-04-19 DIAGNOSIS — M25511 Pain in right shoulder: Secondary | ICD-10-CM | POA: Diagnosis not present

## 2016-04-19 DIAGNOSIS — M25611 Stiffness of right shoulder, not elsewhere classified: Secondary | ICD-10-CM | POA: Diagnosis not present

## 2016-04-19 DIAGNOSIS — M6281 Muscle weakness (generalized): Secondary | ICD-10-CM | POA: Diagnosis not present

## 2016-04-19 DIAGNOSIS — Z9889 Other specified postprocedural states: Secondary | ICD-10-CM | POA: Diagnosis not present

## 2016-04-26 DIAGNOSIS — M6281 Muscle weakness (generalized): Secondary | ICD-10-CM | POA: Diagnosis not present

## 2016-04-26 DIAGNOSIS — M25611 Stiffness of right shoulder, not elsewhere classified: Secondary | ICD-10-CM | POA: Diagnosis not present

## 2016-04-26 DIAGNOSIS — Z9889 Other specified postprocedural states: Secondary | ICD-10-CM | POA: Diagnosis not present

## 2016-04-26 DIAGNOSIS — M25511 Pain in right shoulder: Secondary | ICD-10-CM | POA: Diagnosis not present

## 2016-05-01 DIAGNOSIS — M6281 Muscle weakness (generalized): Secondary | ICD-10-CM | POA: Diagnosis not present

## 2016-05-01 DIAGNOSIS — M25511 Pain in right shoulder: Secondary | ICD-10-CM | POA: Diagnosis not present

## 2016-05-01 DIAGNOSIS — Z9889 Other specified postprocedural states: Secondary | ICD-10-CM | POA: Diagnosis not present

## 2016-05-01 DIAGNOSIS — M25611 Stiffness of right shoulder, not elsewhere classified: Secondary | ICD-10-CM | POA: Diagnosis not present

## 2016-05-03 DIAGNOSIS — Z9889 Other specified postprocedural states: Secondary | ICD-10-CM | POA: Diagnosis not present

## 2016-05-03 DIAGNOSIS — M6281 Muscle weakness (generalized): Secondary | ICD-10-CM | POA: Diagnosis not present

## 2016-05-03 DIAGNOSIS — M25611 Stiffness of right shoulder, not elsewhere classified: Secondary | ICD-10-CM | POA: Diagnosis not present

## 2016-05-03 DIAGNOSIS — M25511 Pain in right shoulder: Secondary | ICD-10-CM | POA: Diagnosis not present

## 2016-05-08 DIAGNOSIS — M25611 Stiffness of right shoulder, not elsewhere classified: Secondary | ICD-10-CM | POA: Diagnosis not present

## 2016-05-08 DIAGNOSIS — M25511 Pain in right shoulder: Secondary | ICD-10-CM | POA: Diagnosis not present

## 2016-05-08 DIAGNOSIS — M6281 Muscle weakness (generalized): Secondary | ICD-10-CM | POA: Diagnosis not present

## 2016-05-08 DIAGNOSIS — Z9889 Other specified postprocedural states: Secondary | ICD-10-CM | POA: Diagnosis not present

## 2016-05-10 DIAGNOSIS — Z9889 Other specified postprocedural states: Secondary | ICD-10-CM | POA: Diagnosis not present

## 2016-05-10 DIAGNOSIS — M6281 Muscle weakness (generalized): Secondary | ICD-10-CM | POA: Diagnosis not present

## 2016-05-10 DIAGNOSIS — M25611 Stiffness of right shoulder, not elsewhere classified: Secondary | ICD-10-CM | POA: Diagnosis not present

## 2016-05-10 DIAGNOSIS — M25511 Pain in right shoulder: Secondary | ICD-10-CM | POA: Diagnosis not present

## 2016-05-14 DIAGNOSIS — Z9889 Other specified postprocedural states: Secondary | ICD-10-CM | POA: Diagnosis not present

## 2016-05-14 DIAGNOSIS — M6281 Muscle weakness (generalized): Secondary | ICD-10-CM | POA: Diagnosis not present

## 2016-05-14 DIAGNOSIS — M25511 Pain in right shoulder: Secondary | ICD-10-CM | POA: Diagnosis not present

## 2016-05-14 DIAGNOSIS — M25611 Stiffness of right shoulder, not elsewhere classified: Secondary | ICD-10-CM | POA: Diagnosis not present

## 2016-05-16 DIAGNOSIS — M25611 Stiffness of right shoulder, not elsewhere classified: Secondary | ICD-10-CM | POA: Diagnosis not present

## 2016-05-16 DIAGNOSIS — M6281 Muscle weakness (generalized): Secondary | ICD-10-CM | POA: Diagnosis not present

## 2016-05-16 DIAGNOSIS — M25511 Pain in right shoulder: Secondary | ICD-10-CM | POA: Diagnosis not present

## 2016-05-16 DIAGNOSIS — Z9889 Other specified postprocedural states: Secondary | ICD-10-CM | POA: Diagnosis not present

## 2016-05-25 DIAGNOSIS — M25611 Stiffness of right shoulder, not elsewhere classified: Secondary | ICD-10-CM | POA: Diagnosis not present

## 2016-05-25 DIAGNOSIS — M6281 Muscle weakness (generalized): Secondary | ICD-10-CM | POA: Diagnosis not present

## 2016-05-25 DIAGNOSIS — Z9889 Other specified postprocedural states: Secondary | ICD-10-CM | POA: Diagnosis not present

## 2016-05-25 DIAGNOSIS — M25511 Pain in right shoulder: Secondary | ICD-10-CM | POA: Diagnosis not present

## 2016-05-28 DIAGNOSIS — Z9889 Other specified postprocedural states: Secondary | ICD-10-CM | POA: Diagnosis not present

## 2016-05-28 DIAGNOSIS — M6281 Muscle weakness (generalized): Secondary | ICD-10-CM | POA: Diagnosis not present

## 2016-05-28 DIAGNOSIS — M25611 Stiffness of right shoulder, not elsewhere classified: Secondary | ICD-10-CM | POA: Diagnosis not present

## 2016-05-28 DIAGNOSIS — M25511 Pain in right shoulder: Secondary | ICD-10-CM | POA: Diagnosis not present

## 2016-05-30 DIAGNOSIS — M25511 Pain in right shoulder: Secondary | ICD-10-CM | POA: Diagnosis not present

## 2016-05-30 DIAGNOSIS — M6281 Muscle weakness (generalized): Secondary | ICD-10-CM | POA: Diagnosis not present

## 2016-05-30 DIAGNOSIS — M25611 Stiffness of right shoulder, not elsewhere classified: Secondary | ICD-10-CM | POA: Diagnosis not present

## 2016-05-30 DIAGNOSIS — Z9889 Other specified postprocedural states: Secondary | ICD-10-CM | POA: Diagnosis not present

## 2016-06-06 DIAGNOSIS — M25611 Stiffness of right shoulder, not elsewhere classified: Secondary | ICD-10-CM | POA: Diagnosis not present

## 2016-06-06 DIAGNOSIS — M25511 Pain in right shoulder: Secondary | ICD-10-CM | POA: Diagnosis not present

## 2016-06-06 DIAGNOSIS — M6281 Muscle weakness (generalized): Secondary | ICD-10-CM | POA: Diagnosis not present

## 2016-06-06 DIAGNOSIS — Z9889 Other specified postprocedural states: Secondary | ICD-10-CM | POA: Diagnosis not present

## 2016-06-28 DIAGNOSIS — H9191 Unspecified hearing loss, right ear: Secondary | ICD-10-CM | POA: Diagnosis not present

## 2016-06-28 DIAGNOSIS — E7800 Pure hypercholesterolemia, unspecified: Secondary | ICD-10-CM | POA: Diagnosis not present

## 2016-06-28 DIAGNOSIS — E039 Hypothyroidism, unspecified: Secondary | ICD-10-CM | POA: Diagnosis not present

## 2016-06-28 DIAGNOSIS — E119 Type 2 diabetes mellitus without complications: Secondary | ICD-10-CM | POA: Diagnosis not present

## 2016-07-25 DIAGNOSIS — H903 Sensorineural hearing loss, bilateral: Secondary | ICD-10-CM | POA: Diagnosis not present

## 2016-07-25 DIAGNOSIS — H9319 Tinnitus, unspecified ear: Secondary | ICD-10-CM | POA: Diagnosis not present

## 2016-07-25 DIAGNOSIS — L299 Pruritus, unspecified: Secondary | ICD-10-CM | POA: Diagnosis not present

## 2016-07-31 ENCOUNTER — Other Ambulatory Visit: Payer: Self-pay | Admitting: Obstetrics and Gynecology

## 2016-07-31 DIAGNOSIS — Z1211 Encounter for screening for malignant neoplasm of colon: Secondary | ICD-10-CM | POA: Diagnosis not present

## 2016-07-31 DIAGNOSIS — Z1231 Encounter for screening mammogram for malignant neoplasm of breast: Secondary | ICD-10-CM

## 2016-07-31 DIAGNOSIS — Z01419 Encounter for gynecological examination (general) (routine) without abnormal findings: Secondary | ICD-10-CM | POA: Diagnosis not present

## 2016-08-07 ENCOUNTER — Ambulatory Visit
Admission: RE | Admit: 2016-08-07 | Discharge: 2016-08-07 | Disposition: A | Discharge status: Home / Self Care | Payer: Commercial Managed Care - HMO | Source: Ambulatory Visit | Attending: Obstetrics and Gynecology | Admitting: Obstetrics and Gynecology

## 2016-08-07 ENCOUNTER — Other Ambulatory Visit: Payer: Self-pay | Admitting: Obstetrics and Gynecology

## 2016-08-07 DIAGNOSIS — Z1231 Encounter for screening mammogram for malignant neoplasm of breast: Secondary | ICD-10-CM | POA: Diagnosis not present

## 2016-08-07 HISTORY — DX: Malignant (primary) neoplasm, unspecified: C80.1

## 2016-09-03 DIAGNOSIS — Z23 Encounter for immunization: Secondary | ICD-10-CM | POA: Diagnosis not present

## 2016-09-03 DIAGNOSIS — M67441 Ganglion, right hand: Secondary | ICD-10-CM | POA: Diagnosis not present

## 2016-09-03 DIAGNOSIS — E119 Type 2 diabetes mellitus without complications: Secondary | ICD-10-CM | POA: Diagnosis not present

## 2016-09-19 DIAGNOSIS — M25541 Pain in joints of right hand: Secondary | ICD-10-CM | POA: Diagnosis not present

## 2016-09-19 DIAGNOSIS — M67431 Ganglion, right wrist: Secondary | ICD-10-CM | POA: Diagnosis not present

## 2016-10-02 DIAGNOSIS — H2512 Age-related nuclear cataract, left eye: Secondary | ICD-10-CM | POA: Diagnosis not present

## 2016-10-04 ENCOUNTER — Encounter: Payer: Self-pay | Admitting: *Deleted

## 2016-10-10 ENCOUNTER — Ambulatory Visit
Admission: RE | Admit: 2016-10-10 | Discharge: 2016-10-10 | Disposition: A | Discharge status: Home / Self Care | Payer: Commercial Managed Care - HMO | Source: Ambulatory Visit | Attending: Surgery | Admitting: Surgery

## 2016-10-10 ENCOUNTER — Encounter
Admission: RE | Disposition: A | Discharge status: Home / Self Care | Payer: Self-pay | Source: Ambulatory Visit | Attending: Surgery

## 2016-10-10 ENCOUNTER — Ambulatory Visit: Payer: Commercial Managed Care - HMO | Admitting: Anesthesiology

## 2016-10-10 DIAGNOSIS — E119 Type 2 diabetes mellitus without complications: Secondary | ICD-10-CM | POA: Diagnosis not present

## 2016-10-10 DIAGNOSIS — Z87891 Personal history of nicotine dependence: Secondary | ICD-10-CM | POA: Insufficient documentation

## 2016-10-10 DIAGNOSIS — E785 Hyperlipidemia, unspecified: Secondary | ICD-10-CM | POA: Insufficient documentation

## 2016-10-10 DIAGNOSIS — M67431 Ganglion, right wrist: Secondary | ICD-10-CM | POA: Insufficient documentation

## 2016-10-10 DIAGNOSIS — M67941 Unspecified disorder of synovium and tendon, right hand: Secondary | ICD-10-CM | POA: Diagnosis not present

## 2016-10-10 DIAGNOSIS — E039 Hypothyroidism, unspecified: Secondary | ICD-10-CM | POA: Insufficient documentation

## 2016-10-10 DIAGNOSIS — Z79899 Other long term (current) drug therapy: Secondary | ICD-10-CM | POA: Diagnosis not present

## 2016-10-10 DIAGNOSIS — M778 Other enthesopathies, not elsewhere classified: Secondary | ICD-10-CM | POA: Diagnosis not present

## 2016-10-10 HISTORY — PX: GANGLION CYST EXCISION: SHX1691

## 2016-10-10 SURGERY — EXCISION, GANGLION CYST, WRIST
Anesthesia: Regional | Laterality: Right | Wound class: Clean

## 2016-10-10 MED ORDER — MIDAZOLAM HCL 5 MG/5ML IJ SOLN
INTRAMUSCULAR | Status: DC | PRN
  Administered 2016-10-10: 2 mg via INTRAVENOUS

## 2016-10-10 MED ORDER — OXYCODONE HCL 5 MG PO TABS: 5.0000 mg | ORAL_TABLET | ORAL | 0 refills | Status: DC | PRN

## 2016-10-10 MED ORDER — DEXTROSE 5 % IV SOLN
2000.0000 mg | Freq: Once | INTRAVENOUS | Status: AC
  Administered 2016-10-10: 2000 mg via INTRAVENOUS

## 2016-10-10 MED ORDER — FENTANYL CITRATE (PF) 100 MCG/2ML IJ SOLN
INTRAMUSCULAR | Status: DC | PRN
  Administered 2016-10-10 (×2): 50 ug via INTRAVENOUS

## 2016-10-10 MED ORDER — ONDANSETRON HCL 4 MG/2ML IJ SOLN
INTRAMUSCULAR | Status: DC | PRN
  Administered 2016-10-10: 4 mg via INTRAVENOUS

## 2016-10-10 MED ORDER — PROPOFOL 500 MG/50ML IV EMUL
INTRAVENOUS | Status: DC | PRN
  Administered 2016-10-10: 25 ug/kg/min via INTRAVENOUS

## 2016-10-10 MED ORDER — BUPIVACAINE HCL (PF) 0.5 % IJ SOLN
INTRAMUSCULAR | Status: DC | PRN
Start: 2016-10-10 — End: 2016-10-10
  Administered 2016-10-10: 5 mL

## 2016-10-10 MED ORDER — LACTATED RINGERS IV SOLN
INTRAVENOUS | Status: DC
  Administered 2016-10-10: 12:00:00 via INTRAVENOUS

## 2016-10-10 SURGICAL SUPPLY — 26 items
BANDAGE ELASTIC 2 LF NS (GAUZE/BANDAGES/DRESSINGS) ×2 IMPLANT
BANDAGE ELASTIC 3 LF NS (GAUZE/BANDAGES/DRESSINGS) IMPLANT
BENZOIN TINCTURE PRP APPL 2/3 (GAUZE/BANDAGES/DRESSINGS) ×2 IMPLANT
BNDG ESMARK 4X12 TAN STRL LF (GAUZE/BANDAGES/DRESSINGS) ×2 IMPLANT
CHLORAPREP W/TINT 26ML (MISCELLANEOUS) ×2 IMPLANT
CORD BIP STRL DISP 12FT (MISCELLANEOUS) ×2 IMPLANT
COVER LIGHT HANDLE UNIVERSAL (MISCELLANEOUS) ×4 IMPLANT
CUFF TOURNIQUET DUAL PORT 18X3 (MISCELLANEOUS) ×2 IMPLANT
GAUZE PETRO XEROFOAM 1X8 (MISCELLANEOUS) ×2 IMPLANT
GAUZE SPONGE 4X4 12PLY STRL (GAUZE/BANDAGES/DRESSINGS) ×2 IMPLANT
GLOVE BIO SURGEON STRL SZ8 (GLOVE) ×4 IMPLANT
GLOVE INDICATOR 8.0 STRL GRN (GLOVE) ×2 IMPLANT
GOWN STRL REUS W/ TWL LRG LVL3 (GOWN DISPOSABLE) ×1 IMPLANT
GOWN STRL REUS W/ TWL XL LVL3 (GOWN DISPOSABLE) ×1 IMPLANT
GOWN STRL REUS W/TWL LRG LVL3 (GOWN DISPOSABLE) ×1
GOWN STRL REUS W/TWL XL LVL3 (GOWN DISPOSABLE) ×1
KIT ROOM TURNOVER OR (KITS) ×2 IMPLANT
NS IRRIG 500ML POUR BTL (IV SOLUTION) ×2 IMPLANT
PACK EXTREMITY ARMC (MISCELLANEOUS) ×2 IMPLANT
STOCKINETTE IMPERVIOUS 9X36 MD (GAUZE/BANDAGES/DRESSINGS) ×2 IMPLANT
STRAP BODY AND KNEE 60X3 (MISCELLANEOUS) ×2 IMPLANT
STRIP CLOSURE SKIN 1/4X4 (GAUZE/BANDAGES/DRESSINGS) ×2 IMPLANT
SUT PROLENE 4 0 PS 2 18 (SUTURE) ×2 IMPLANT
SUT PROLENE 6 0 P 1 18 (SUTURE) ×2 IMPLANT
SUT VIC AB 3-0 SH 27 (SUTURE)
SUT VIC AB 3-0 SH 27X BRD (SUTURE) IMPLANT

## 2016-10-10 NOTE — Op Note (Signed)
10/10/2016  1:36 PM  Patient:   Gloria Eaton  Pre-Op Diagnosis:   Soft tissue mass, dorsal right wrist.  Post-Op Diagnosis:   Soft tissue mass secondary to tendinopathy of extensor digitorum communis tendon to long finger, dorsal right wrist.  Procedure:   Debridement of extensor tendon mass, right long finger.  Surgeon:   Pascal Lux, MD  Assistant:   None  Anesthesia:   None  Findings:   As above.  Complications:   None  EBL:   0 cc  Fluids:   750 cc crystalloid  TT:   31 minutes at 250 mmHg  Drains:   None  Closure:   3-0 Vicryl subcuticular sutures  Brief Clinical Note:   The patient is a 75 year old female with a several month history of a tender soft tissue mass on the dorsal aspect of her right wrist. Her history and examination were consistent with a right dorsal carpal ganglion, although the mass did move with flexion and extension of the long finger. The patient presents at this time for excision of the presumed right dorsal carpal ganglion.  Procedure:   The patient was brought into the operating room and lain in the supine position. A timeout was performed to verify the appropriate surgical site. After adequate IV sedation was achieved, a Bier block was placed by the anesthesiologist and the tourniquet inflated to 250 mmHg. The right hand and upper extremity were prepped with ChloraPrep solution before being draped sterilely. Preoperative antibiotics were administered. An approximately 1.5-2 cm incision was made transversely over the soft tissue mass in line with the wrist extension crease. The incision was carried down through the subcutaneous tissues with care taken to identify and protect any neurovascular structures. The mass was identified as part of the extensor communis tendon to the long finger. Careful inspection demonstrated what appeared to be cystic degeneration within the substance of the tendon itself. Therefore, the tendon was incised longitudinally  and the degenerative portion of the tendon debrided sharply with a #15 blade. After debridement, the tendon itself was carefully inspected. There appeared to be sufficient bulk to the tendon so that no reinforcement or other reconstructive procedure was required. The long finger was flexed and extended well the tendon excursion was assessed. The tendon appeared to move freely and without crepitance. The mass also appeared to have been excised in its entirety both visually and by palpation.  The wound was copiously irrigated with sterile saline solution before the skin was closed using 3-0 Vicryl subcuticular sutures. Benzoin and Steri-Strips were applied to the skin before a total of 5 cc of 0.5% plain Sensorcaine was injected in and around the incision to help with postoperative analgesia. A sterile bulky dressing was applied to the wrist. The patient was then awakened and returned to the recovery room in satisfactory condition after tolerating the procedure well.

## 2016-10-10 NOTE — Transfer of Care (Signed)
Immediate Anesthesia Transfer of Care Note  Patient: Gloria Eaton  Procedure(s) Performed: Procedure(s): EXCISION OF A RIGHT DORSAL CARPAL GANGLION (Right)  Patient Location: PACU  Anesthesia Type: Bier Block  Level of Consciousness: awake, alert  and patient cooperative  Airway and Oxygen Therapy: Patient Spontanous Breathing and Patient connected to supplemental oxygen  Post-op Assessment: Post-op Vital signs reviewed, Patient's Cardiovascular Status Stable, Respiratory Function Stable, Patent Airway and No signs of Nausea or vomiting  Post-op Vital Signs: Reviewed and stable  Complications: No apparent anesthesia complications

## 2016-10-10 NOTE — Anesthesia Procedure Notes (Signed)
Anesthesia Regional Block:  Bier block (IV Regional)  Pre-Anesthetic Checklist: ,, timeout performed, Correct Patient, Correct Site, Correct Laterality, Correct Procedure, Correct Position, site marked, Risks and benefits discussed,  Surgical consent,  Pre-op evaluation,  At surgeon's request and post-op pain management  Laterality: Right     Bier block (IV Regional) Narrative:  Start time: 10/10/2016 1:01 PM End time: 10/10/2016 1:02 PM  Performed by: With CRNAs  Anesthesiologist: BACON, DAVID  Additional Notes: Double tourniquet applied. Esmarch exsanguination. Distal and proximal tourniquet inflated, distal deflated. Radial pulse checked (-). 90ml 0.5% Lidocaine plain injected without difficulty. 20g angio d/c'd from R hand. Tolerated well by pt.

## 2016-10-10 NOTE — H&P (Signed)
Paper H&P to be scanned into permanent record. H&P reviewed. No changes. 

## 2016-10-10 NOTE — Anesthesia Preprocedure Evaluation (Addendum)
Anesthesia Evaluation  Patient identified by MRN, date of birth, ID band Patient awake    Reviewed: Allergy & Precautions, H&P , NPO status , Patient's Chart, lab work & pertinent test results, reviewed documented beta blocker date and time   Airway Mallampati: II  TM Distance: >3 FB Neck ROM: full    Dental no notable dental hx. (+) Upper Dentures, Lower Dentures   Pulmonary former smoker,    Pulmonary exam normal breath sounds clear to auscultation       Cardiovascular Exercise Tolerance: Good negative cardio ROS  + Valvular Problems/Murmurs  Rhythm:regular Rate:Normal     Neuro/Psych negative neurological ROS  negative psych ROS   GI/Hepatic negative GI ROS, Neg liver ROS,   Endo/Other  Hypothyroidism   Renal/GU negative Renal ROS  negative genitourinary   Musculoskeletal   Abdominal   Peds  Hematology negative hematology ROS (+)   Anesthesia Other Findings   Reproductive/Obstetrics negative OB ROS                            Anesthesia Physical Anesthesia Plan  ASA: II  Anesthesia Plan: Bier Block   Post-op Pain Management:    Induction:   Airway Management Planned:   Additional Equipment:   Intra-op Plan:   Post-operative Plan:   Informed Consent: I have reviewed the patients History and Physical, chart, labs and discussed the procedure including the risks, benefits and alternatives for the proposed anesthesia with the patient or authorized representative who has indicated his/her understanding and acceptance.   Dental Advisory Given  Plan Discussed with: CRNA and Anesthesiologist  Anesthesia Plan Comments:         Anesthesia Quick Evaluation

## 2016-10-10 NOTE — Discharge Instructions (Addendum)
General Anesthesia, Adult, Care After Refer to this sheet in the next few weeks. These instructions provide you with information on caring for yourself after your procedure. Your health care provider may also give you more specific instructions. Your treatment has been planned according to current medical practices, but problems sometimes occur. Call your health care provider if you have any problems or questions after your procedure. WHAT TO EXPECT AFTER THE PROCEDURE After the procedure, it is typical to experience:  Sleepiness.  Nausea and vomiting. HOME CARE INSTRUCTIONS  For the first 24 hours after general anesthesia:  Have a responsible person with you.  Do not drive a car. If you are alone, do not take public transportation.  Do not drink alcohol.  Do not take medicine that has not been prescribed by your health care provider.  Do not sign important papers or make important decisions.  You may resume a normal diet and activities as directed by your health care provider.  Change bandages (dressings) as directed.  If you have questions or problems that seem related to general anesthesia, call the hospital and ask for the anesthetist or anesthesiologist on call. SEEK MEDICAL CARE IF:  You have nausea and vomiting that continue the day after anesthesia.  You develop a rash. SEEK IMMEDIATE MEDICAL CARE IF:   You have difficulty breathing.  You have chest pain.  You have any allergic problems.   This information is not intended to replace advice given to you by your health care provider. Make sure you discuss any questions you have with your health care provider.   Document Released: 03/18/2001 Document Revised: 12/31/2014 Document Reviewed: 04/09/2012 Elsevier Interactive Patient Education 2016 [employer].  Keep dressing dry and intact. Keep hand elevated above heart level. May shower after dressing removed on postop day 4 (Sunday). Cover wound with large  Band-Aid or ace-wrap after drying off. Apply ice to affected area frequently. Take ibuprofen 600 mg TID with meals as necessary. Take ES Tylenol or pain medication as prescribed when needed.  Return for follow-up in 10-14 days or as scheduled.

## 2016-10-10 NOTE — Anesthesia Postprocedure Evaluation (Signed)
Anesthesia Post Note  Patient: Gloria Eaton  Procedure(s) Performed: Procedure(s) (LRB): EXCISION OF A RIGHT DORSAL CARPAL GANGLION (Right)  Patient location during evaluation: PACU Anesthesia Type: MAC Level of consciousness: awake and alert Pain management: pain level controlled Vital Signs Assessment: post-procedure vital signs reviewed and stable Respiratory status: spontaneous breathing, nonlabored ventilation and respiratory function stable Cardiovascular status: stable and blood pressure returned to baseline Anesthetic complications: no    Trecia Rogers

## 2016-10-10 NOTE — Anesthesia Procedure Notes (Signed)
Procedure Name: MAC Performed by: Brookelin Felber Pre-anesthesia Checklist: Patient identified, Emergency Drugs available, Suction available, Timeout performed and Patient being monitored Patient Re-evaluated:Patient Re-evaluated prior to inductionOxygen Delivery Method: Simple face mask Placement Confirmation: positive ETCO2       

## 2016-10-11 ENCOUNTER — Encounter: Payer: Self-pay | Admitting: Surgery

## 2016-10-12 LAB — SURGICAL PATHOLOGY

## 2017-02-05 DIAGNOSIS — E039 Hypothyroidism, unspecified: Secondary | ICD-10-CM | POA: Diagnosis not present

## 2017-02-05 DIAGNOSIS — E119 Type 2 diabetes mellitus without complications: Secondary | ICD-10-CM | POA: Diagnosis not present

## 2017-02-05 DIAGNOSIS — E7800 Pure hypercholesterolemia, unspecified: Secondary | ICD-10-CM | POA: Diagnosis not present

## 2017-08-02 DIAGNOSIS — E039 Hypothyroidism, unspecified: Secondary | ICD-10-CM | POA: Diagnosis not present

## 2017-08-02 DIAGNOSIS — E119 Type 2 diabetes mellitus without complications: Secondary | ICD-10-CM | POA: Diagnosis not present

## 2017-08-02 DIAGNOSIS — Z86010 Personal history of colonic polyps: Secondary | ICD-10-CM | POA: Diagnosis not present

## 2017-08-02 DIAGNOSIS — E782 Mixed hyperlipidemia: Secondary | ICD-10-CM | POA: Diagnosis not present

## 2017-08-02 DIAGNOSIS — Z79899 Other long term (current) drug therapy: Secondary | ICD-10-CM | POA: Diagnosis not present

## 2017-08-02 DIAGNOSIS — Z1211 Encounter for screening for malignant neoplasm of colon: Secondary | ICD-10-CM | POA: Diagnosis not present

## 2017-08-06 ENCOUNTER — Other Ambulatory Visit: Payer: Self-pay | Admitting: Obstetrics and Gynecology

## 2017-08-06 DIAGNOSIS — E782 Mixed hyperlipidemia: Secondary | ICD-10-CM | POA: Diagnosis not present

## 2017-08-06 DIAGNOSIS — Z01419 Encounter for gynecological examination (general) (routine) without abnormal findings: Secondary | ICD-10-CM | POA: Diagnosis not present

## 2017-08-06 DIAGNOSIS — E119 Type 2 diabetes mellitus without complications: Secondary | ICD-10-CM | POA: Diagnosis not present

## 2017-08-06 DIAGNOSIS — E039 Hypothyroidism, unspecified: Secondary | ICD-10-CM | POA: Diagnosis not present

## 2017-08-06 DIAGNOSIS — Z79899 Other long term (current) drug therapy: Secondary | ICD-10-CM | POA: Diagnosis not present

## 2017-08-06 DIAGNOSIS — Z1231 Encounter for screening mammogram for malignant neoplasm of breast: Secondary | ICD-10-CM

## 2017-08-06 DIAGNOSIS — L298 Other pruritus: Secondary | ICD-10-CM | POA: Diagnosis not present

## 2017-08-06 DIAGNOSIS — Z1211 Encounter for screening for malignant neoplasm of colon: Secondary | ICD-10-CM | POA: Diagnosis not present

## 2017-08-13 ENCOUNTER — Ambulatory Visit
Admission: RE | Admit: 2017-08-13 | Discharge: 2017-08-13 | Disposition: A | Discharge status: Home / Self Care | Payer: Medicare HMO | Source: Ambulatory Visit | Attending: Obstetrics and Gynecology | Admitting: Obstetrics and Gynecology

## 2017-08-13 DIAGNOSIS — Z1231 Encounter for screening mammogram for malignant neoplasm of breast: Secondary | ICD-10-CM | POA: Insufficient documentation

## 2017-09-25 DIAGNOSIS — R195 Other fecal abnormalities: Secondary | ICD-10-CM | POA: Diagnosis not present

## 2017-09-25 DIAGNOSIS — Z86010 Personal history of colonic polyps: Secondary | ICD-10-CM | POA: Diagnosis not present

## 2017-09-26 DIAGNOSIS — H2512 Age-related nuclear cataract, left eye: Secondary | ICD-10-CM | POA: Diagnosis not present

## 2017-09-30 ENCOUNTER — Emergency Department
Admission: EM | Admit: 2017-09-30 | Discharge: 2017-09-30 | Disposition: A | Discharge status: Home / Self Care | Payer: Medicare HMO | Attending: Emergency Medicine | Admitting: Emergency Medicine

## 2017-09-30 ENCOUNTER — Encounter: Payer: Self-pay | Admitting: *Deleted

## 2017-09-30 ENCOUNTER — Emergency Department: Payer: Medicare HMO

## 2017-09-30 DIAGNOSIS — R51 Headache: Secondary | ICD-10-CM | POA: Insufficient documentation

## 2017-09-30 DIAGNOSIS — R519 Headache, unspecified: Secondary | ICD-10-CM

## 2017-09-30 DIAGNOSIS — Z79899 Other long term (current) drug therapy: Secondary | ICD-10-CM | POA: Diagnosis not present

## 2017-09-30 DIAGNOSIS — Z87891 Personal history of nicotine dependence: Secondary | ICD-10-CM | POA: Insufficient documentation

## 2017-09-30 DIAGNOSIS — I10 Essential (primary) hypertension: Secondary | ICD-10-CM | POA: Diagnosis not present

## 2017-09-30 DIAGNOSIS — E039 Hypothyroidism, unspecified: Secondary | ICD-10-CM | POA: Insufficient documentation

## 2017-09-30 LAB — CBC
HCT: 44.4 % (ref 35.0–47.0)
Hemoglobin: 14.7 g/dL (ref 12.0–16.0)
MCH: 27.1 pg (ref 26.0–34.0)
MCHC: 33.1 g/dL (ref 32.0–36.0)
MCV: 81.8 fL (ref 80.0–100.0)
PLATELETS: 226 10*3/uL (ref 150–440)
RBC: 5.42 MIL/uL — ABNORMAL HIGH (ref 3.80–5.20)
RDW: 14.2 % (ref 11.5–14.5)
WBC: 9 10*3/uL (ref 3.6–11.0)

## 2017-09-30 LAB — BASIC METABOLIC PANEL
Anion gap: 9 (ref 5–15)
BUN: 11 mg/dL (ref 6–20)
CALCIUM: 9.5 mg/dL (ref 8.9–10.3)
CHLORIDE: 99 mmol/L — AB (ref 101–111)
CO2: 31 mmol/L (ref 22–32)
CREATININE: 0.61 mg/dL (ref 0.44–1.00)
Glucose, Bld: 121 mg/dL — ABNORMAL HIGH (ref 65–99)
Potassium: 4.1 mmol/L (ref 3.5–5.1)
SODIUM: 139 mmol/L (ref 135–145)

## 2017-09-30 MED ORDER — IBUPROFEN 600 MG PO TABS
600.0000 mg | ORAL_TABLET | Freq: Once | ORAL | Status: AC
  Administered 2017-09-30: 600 mg via ORAL
  Filled 2017-09-30: qty 1

## 2017-09-30 NOTE — Discharge Instructions (Signed)
You were evaluated for headache and elevated blood pressure, and as we discussed although no certain cause was found for your exam and evaluation are overall reassuring in the emergency department today.  I am most suspicious for sinus congestion related headache and elevated blood pressures due to headache pain.  As we discussed, please return emerged immediately for any new or worsening or uncontrolled headache, neck pain or neck stiffness, fever, vision changes, weakness, numbness, confusion altered mental status, slurred speech or trouble finding her words.  In terms her blood pressure, I suspect that it is elevated due to the issue with the headache. Please follow-up with your primary care doctor's office in about one week for recheck.  for headache you may try over-the-counter Tylenol and/or ibuprofen and use as directed on label.  For sinus congestion, you may try over-the-counter Mucinex as you have been doing. You may try over-the-counter Zyrtec for allergy symptoms. You may try over-the-counter Flonase for about a week or so as well.

## 2017-09-30 NOTE — ED Provider Notes (Signed)
[facility] Emergency Department Provider Note ____________________________________________   I have reviewed the triage vital signs and the triage nursing note.  HISTORY  Chief Complaint Headache   Historian Patient And husband  HPI Gloria Eaton is a 76 y.o. female with no major medical problems presents today with a headache that started gradually overnight at some time and was still there this morning. Located frontally and radiated up to 7 out of 10, currently 6 out of 10. No vision changes, fever, neck pain or stiffness, slurred speech, weakness, numbness. She does have a mild sore throat after [place].  She tried nothing at home for the pain.  She actually came over mostly because when she took her blood pressure at home they found that it was elevated around 208/117. reports no history of high blood pressure.    Past Medical History:  Diagnosis Date  . Cancer ([place])    skin ca  . Heart murmur    followed by PCP  . Hyperlipidemia   . Hypothyroidism   . Osteopenia   . Prediabetes   . Wears dentures    full upper and lower    There are no active problems to display for this patient.   Past Surgical History:  Procedure Laterality Date  . ABDOMINAL HYSTERECTOMY    . BREAST CYST ASPIRATION Right    neg  . 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 GANGLION CYST EXCISION Right 10/10/2016   Procedure: EXCISION OF A RIGHT DORSAL CARPAL GANGLION;  Surgeon: Corky Mull, MD;  Location: [facility];  Service: Orthopedics;  Laterality: Right;  . SHOULDER ARTHROSCOPY Right 02/22/2016   Procedure: Extensive arthroscopic debridement, arthroscopic subscapularis tendon tear, arthroscopic subacromial decompression, mini-open rotator cuff repair, and mini-open biceps tenodesis, right shoulder. ;  Surgeon: Corky Mull, MD;  Location: [facility];  Service: Orthopedics;  Laterality:  Right;    Prior to Admission medications   Medication Sig Start Date End Date Taking? Authorizing Provider  ascorbic acid (VITAMIN C) 1000 MG tablet Take 1,000 mg by mouth daily.    [provider]  Calcium Carbonate-Vitamin D (CALTRATE 600+D PO) Take by mouth 2 (two) times daily.    [provider]  levothyroxine (SYNTHROID, LEVOTHROID) 88 MCG tablet Take 88 mcg by mouth daily before breakfast.    [provider]  lovastatin (MEVACOR) 20 MG tablet Take 20 mg by mouth at bedtime.    [provider]  Multiple Vitamin (MULTIVITAMIN) capsule Take 1 capsule by mouth daily.    [provider]  nystatin cream (MYCOSTATIN) Apply 1 application topically as needed for dry skin.    [provider]  Omega-3 Fatty Acids (OMEGA-3 FISH OIL PO) Take by mouth daily.    [provider]  oxyCODONE (ROXICODONE) 5 MG immediate release tablet Take 1-2 tablets (5-10 mg total) by mouth every 4 (four) hours as needed for severe pain. 10/10/16   Poggi, Marshall Cork, MD  triamcinolone cream (KENALOG) 0.1 % Apply 1 application topically as needed.    [provider]  vitamin E 400 UNIT capsule Take 400 Units by mouth daily.    [provider]    Allergies  Allergen Reactions  . Vicodin [Hydrocodone-Acetaminophen] Nausea And Vomiting    Family History  Problem Relation Age of Onset  . Breast cancer Neg Hx     Social History Social History  Substance  Use Topics  . Smoking status: Former Smoker    Quit date: 11/11/2000  . Smokeless tobacco: Never Used  . Alcohol use No    Review of Systems  Constitutional: Negative for fever. Eyes: Negative for visual changes. ENT: Negative for sore throat. Cardiovascular: Negative for chest pain. Respiratory: Negative for shortness of breath. Gastrointestinal: Negative for abdominal pain, vomiting and diarrhea. Genitourinary: Negative for dysuria. Musculoskeletal: Negative for back  pain. Skin: Negative for rash. Neurological:  Positive for headache.  ____________________________________________   PHYSICAL EXAM:  VITAL SIGNS: ED Triage Vitals  Enc Vitals Group     BP 09/30/17 1038 (!) 183/90     Pulse Rate 09/30/17 1038 98     Resp 09/30/17 1038 20     Temp 09/30/17 1038 99.3 F (37.4 C)     Temp Source 09/30/17 1038 Oral     SpO2 09/30/17 1038 96 %     Weight 09/30/17 1039 136 lb (61.7 kg)     Height 09/30/17 1039 5' (1.524 m)     Head Circumference --      Peak Flow --      Pain Score 09/30/17 1038 7     Pain Loc --      Pain Edu? --      Excl. in [place]? --      Constitutional: Alert and oriented. Well appearing and in no distress. HEENT   Head: Normocephalic and atraumatic.      Eyes: Conjunctivae are normal. Pupils equal and round.       Ears:         Nose: No congestion/rhinnorhea.   Mouth/Throat: Mucous membranes are moist.   Neck: No stridor. Cardiovascular/Chest: Normal rate, regular rhythm.  No murmurs, rubs, or gallops. Respiratory: Normal respiratory effort without tachypnea nor retractions. Breath sounds are clear and equal bilaterally. No wheezes/rales/rhonchi. Gastrointestinal: Soft. No distention, no guarding, no rebound. Nontender.    Genitourinary/rectal:Deferred Musculoskeletal: Nontender with normal range of motion in all extremities. No joint effusions.  No lower extremity tenderness.  No edema. Neurologic:  Normal speech and language. No gross or focal neurologic deficits are appreciated. Skin:  Skin is warm, dry and intact. No rash noted. Psychiatric: Mood and affect are normal. Speech and behavior are normal. Patient exhibits appropriate insight and judgment.   ____________________________________________  LABS (pertinent positives/negatives) I, Lisa Roca, MD the attending physician have reviewed the labs noted below.  Labs Reviewed  CBC - Abnormal; Notable for the following:       Result Value   RBC 5.42  (*)    All other components within normal limits  BASIC METABOLIC PANEL - Abnormal; Notable for the following:    Chloride 99 (*)    Glucose, Bld 121 (*)    All other components within normal limits    ____________________________________________    EKG I, Lisa Roca, MD, the attending physician have personally viewed and interpreted all ECGs.  none ____________________________________________  RADIOLOGY All Xrays were viewed by me.  Imaging interpreted by Radiologist, and I, Lisa Roca, MD the attending physician have reviewed the radiologist interpretation noted below.   CT head without contrast:  IMPRESSION: 1. No acute intracranial abnormalities. 2. Status post right suboccipital craniectomy. __________________________________________  PROCEDURES  Procedure(s) performed: None  Critical Care performed: None  ____________________________________________  No current facility-administered medications on file prior to encounter.    Current Outpatient Prescriptions on File Prior to Encounter  Medication Sig Dispense Refill  . ascorbic acid (VITAMIN C) 1000 MG  tablet Take 1,000 mg by mouth daily.    . Calcium Carbonate-Vitamin D (CALTRATE 600+D PO) Take by mouth 2 (two) times daily.    Marland Kitchen levothyroxine (SYNTHROID, LEVOTHROID) 88 MCG tablet Take 88 mcg by mouth daily before breakfast.    . lovastatin (MEVACOR) 20 MG tablet Take 20 mg by mouth at bedtime.    . Multiple Vitamin (MULTIVITAMIN) capsule Take 1 capsule by mouth daily.    Marland Kitchen nystatin cream (MYCOSTATIN) Apply 1 application topically as needed for dry skin.    . Omega-3 Fatty Acids (OMEGA-3 FISH OIL PO) Take by mouth daily.    Marland Kitchen oxyCODONE (ROXICODONE) 5 MG immediate release tablet Take 1-2 tablets (5-10 mg total) by mouth every 4 (four) hours as needed for severe pain. 15 tablet 0  . triamcinolone cream (KENALOG) 0.1 % Apply 1 application topically as needed.    . vitamin E 400 UNIT capsule Take 400 Units by  mouth daily.      ____________________________________________  ED COURSE / ASSESSMENT AND PLAN  Pertinent labs & imaging results that were available during my care of the patient were reviewed by me and considered in my medical decision making (see chart for details).    Patient is overall very well-appearing and states her headache was gradual in onset and actually is improving at this point time. She came over not only because she doesn't typically have headaches but also because her blood pressure was elevated at home and she does not have high blood pressure and that worried her.  A sample of the high blood pressure, repeat here in the emergency department 180/90 and patient is improved. She's not had any chest pain or shortness of breath. Her laboratory studies are overall reassuring.  In terms of a headache, no high risk/red flag features and patient will be given some ibuprofen here. I'm most suspicious about sinus headache given the location and description of pain.  Head CT was obtained given that she does not have a history of headaches, and the head CT was negative for acute emergency finding.  She's not had any neurologic symptoms and we discussed return precautions with respect to that.   DIFFERENTIAL DIAGNOSIS: Differential diagnosis includes, but is not limited to, intracranial hemorrhage, meningitis/encephalitis, previous head trauma, cavernous venous thrombosis, tension headache, temporal arteritis, migraine or migraine equivalent, idiopathic intracranial hypertension, and non-specific headache.  CONSULTATIONS:   none  Patient / Family / Caregiver informed of clinical course, medical decision-making process, and agree with plan.   I discussed return precautions, follow-up instructions, and discharge instructions with patient and/or family.  Discharge Instructions : You were evaluated for headache and elevated blood pressure, and as we discussed although no certain  cause was found for your exam and evaluation are overall reassuring in the emergency department today.  I am most suspicious for sinus congestion related headache and elevated blood pressures due to headache pain.  As we discussed, please return emerged immediately for any new or worsening or uncontrolled headache, neck pain or neck stiffness, fever, vision changes, weakness, numbness, confusion altered mental status, slurred speech or trouble finding her words.  In terms her blood pressure, I suspect that it is elevated due to the issue with the headache. Please follow-up with your primary care doctor's office in about one week for recheck.  for headache you may try over-the-counter Tylenol and/or ibuprofen and use as directed on label.  For sinus congestion, you may try over-the-counter Mucinex as you have been doing. You may  try over-the-counter Zyrtec for allergy symptoms. You may try over-the-counter Flonase for about a week or so as well.  ___________________________________________   FINAL CLINICAL IMPRESSION(S) / ED DIAGNOSES   Final diagnoses:  Sinus headache  Hypertension, unspecified type              Note: This dictation was prepared with Dragon dictation. Any transcriptional errors that result from this process are unintentional    Lisa Roca, MD 09/30/17 1309

## 2017-09-30 NOTE — ED Triage Notes (Signed)
Pt complains of a headache starting last night with hypertension, pt denies any other symptoms, pt reports she has not had a headache in 17 years

## 2017-10-08 DIAGNOSIS — R030 Elevated blood-pressure reading, without diagnosis of hypertension: Secondary | ICD-10-CM | POA: Diagnosis not present

## 2017-10-08 DIAGNOSIS — J321 Chronic frontal sinusitis: Secondary | ICD-10-CM | POA: Diagnosis not present

## 2017-10-14 DIAGNOSIS — K635 Polyp of colon: Secondary | ICD-10-CM | POA: Diagnosis not present

## 2017-10-14 DIAGNOSIS — K620 Anal polyp: Secondary | ICD-10-CM | POA: Diagnosis not present

## 2017-10-14 DIAGNOSIS — Z86010 Personal history of colonic polyps: Secondary | ICD-10-CM | POA: Diagnosis not present

## 2017-10-14 DIAGNOSIS — D128 Benign neoplasm of rectum: Secondary | ICD-10-CM | POA: Diagnosis not present

## 2017-10-14 DIAGNOSIS — K621 Rectal polyp: Secondary | ICD-10-CM | POA: Diagnosis not present

## 2017-10-14 HISTORY — PX: COLONOSCOPY: SHX174

## 2017-11-06 DIAGNOSIS — H2512 Age-related nuclear cataract, left eye: Secondary | ICD-10-CM | POA: Diagnosis not present

## 2017-12-30 DIAGNOSIS — H2512 Age-related nuclear cataract, left eye: Secondary | ICD-10-CM | POA: Diagnosis not present

## 2018-01-01 ENCOUNTER — Encounter: Payer: Self-pay | Admitting: *Deleted

## 2018-01-01 ENCOUNTER — Other Ambulatory Visit: Payer: Self-pay

## 2018-01-06 NOTE — Discharge Instructions (Signed)

## 2018-01-08 ENCOUNTER — Ambulatory Visit: Payer: Medicare HMO | Admitting: Anesthesiology

## 2018-01-08 ENCOUNTER — Ambulatory Visit
Admission: RE | Admit: 2018-01-08 | Discharge: 2018-01-08 | Disposition: A | Discharge status: Home / Self Care | Payer: Medicare HMO | Source: Ambulatory Visit | Attending: Ophthalmology | Admitting: Ophthalmology

## 2018-01-08 ENCOUNTER — Encounter
Admission: RE | Disposition: A | Discharge status: Home / Self Care | Payer: Self-pay | Source: Ambulatory Visit | Attending: Ophthalmology

## 2018-01-08 DIAGNOSIS — H2512 Age-related nuclear cataract, left eye: Secondary | ICD-10-CM | POA: Insufficient documentation

## 2018-01-08 DIAGNOSIS — H5703 Miosis: Secondary | ICD-10-CM | POA: Diagnosis not present

## 2018-01-08 DIAGNOSIS — E7800 Pure hypercholesterolemia, unspecified: Secondary | ICD-10-CM | POA: Insufficient documentation

## 2018-01-08 DIAGNOSIS — Z79899 Other long term (current) drug therapy: Secondary | ICD-10-CM | POA: Diagnosis not present

## 2018-01-08 DIAGNOSIS — E039 Hypothyroidism, unspecified: Secondary | ICD-10-CM | POA: Diagnosis not present

## 2018-01-08 DIAGNOSIS — Z87891 Personal history of nicotine dependence: Secondary | ICD-10-CM | POA: Insufficient documentation

## 2018-01-08 HISTORY — PX: CATARACT EXTRACTION W/PHACO: SHX586

## 2018-01-08 SURGERY — PHACOEMULSIFICATION, CATARACT, WITH IOL INSERTION
Anesthesia: Monitor Anesthesia Care | Site: Eye | Laterality: Left | Wound class: Clean

## 2018-01-08 MED ORDER — BRIMONIDINE TARTRATE-TIMOLOL 0.2-0.5 % OP SOLN
OPHTHALMIC | Status: DC | PRN
  Administered 2018-01-08: 1 [drp] via OPHTHALMIC

## 2018-01-08 MED ORDER — LIDOCAINE HCL (PF) 2 % IJ SOLN
INTRAOCULAR | Status: DC | PRN
  Administered 2018-01-08: 1 mL via INTRAMUSCULAR

## 2018-01-08 MED ORDER — MOXIFLOXACIN HCL 0.5 % OP SOLN
1.0000 [drp] | OPHTHALMIC | Status: DC | PRN
  Administered 2018-01-08 (×3): 1 [drp] via OPHTHALMIC

## 2018-01-08 MED ORDER — FENTANYL CITRATE (PF) 100 MCG/2ML IJ SOLN
INTRAMUSCULAR | Status: DC | PRN
  Administered 2018-01-08: 50 ug via INTRAVENOUS

## 2018-01-08 MED ORDER — MIDAZOLAM HCL 2 MG/2ML IJ SOLN
INTRAMUSCULAR | Status: DC | PRN
  Administered 2018-01-08: 2 mg via INTRAVENOUS

## 2018-01-08 MED ORDER — EPINEPHRINE PF 1 MG/ML IJ SOLN
INTRAOCULAR | Status: DC | PRN
  Administered 2018-01-08: 68 mL via OPHTHALMIC

## 2018-01-08 MED ORDER — NA HYALUR & NA CHOND-NA HYALUR 0.4-0.35 ML IO KIT
PACK | INTRAOCULAR | Status: DC | PRN
  Administered 2018-01-08: 1 mL via INTRAOCULAR

## 2018-01-08 MED ORDER — LACTATED RINGERS IV SOLN: 10.0000 mL/h | INTRAVENOUS | Status: DC

## 2018-01-08 MED ORDER — ARMC OPHTHALMIC DILATING DROPS
1.0000 "application " | OPHTHALMIC | Status: DC | PRN
  Administered 2018-01-08 (×3): 1 via OPHTHALMIC

## 2018-01-08 MED ORDER — CEFUROXIME OPHTHALMIC INJECTION 1 MG/0.1 ML
INJECTION | OPHTHALMIC | Status: DC | PRN
  Administered 2018-01-08: 0.1 mL via INTRACAMERAL

## 2018-01-08 SURGICAL SUPPLY — 19 items
AcrySof IQ Toric astigmatism IOL (Intraocular Lens) ×2 IMPLANT
CANNULA ANT/CHMB 27GA (MISCELLANEOUS) ×2 IMPLANT
GLOVE SURG LX 7.5 STRW (GLOVE) ×1
GLOVE SURG LX STRL 7.5 STRW (GLOVE) ×1 IMPLANT
GLOVE SURG TRIUMPH 8.0 PF LTX (GLOVE) ×2 IMPLANT
GOWN STRL REUS W/ TWL LRG LVL3 (GOWN DISPOSABLE) ×2 IMPLANT
GOWN STRL REUS W/TWL LRG LVL3 (GOWN DISPOSABLE) ×2
MARKER SKIN DUAL TIP RULER LAB (MISCELLANEOUS) ×2 IMPLANT
NEEDLE FILTER BLUNT 18X 1/2SAF (NEEDLE) ×1
NEEDLE FILTER BLUNT 18X1 1/2 (NEEDLE) ×1 IMPLANT
PACK CATARACT BRASINGTON (MISCELLANEOUS) ×2 IMPLANT
PACK EYE AFTER SURG (MISCELLANEOUS) ×2 IMPLANT
PACK OPTHALMIC (MISCELLANEOUS) ×2 IMPLANT
RING MALYGIN (MISCELLANEOUS) ×2 IMPLANT
SYR 3ML LL SCALE MARK (SYRINGE) ×2 IMPLANT
SYR 5ML LL (SYRINGE) ×2 IMPLANT
SYR TB 1ML LUER SLIP (SYRINGE) ×2 IMPLANT
WATER STERILE IRR 250ML POUR (IV SOLUTION) ×2 IMPLANT
WIPE NON LINTING 3.25X3.25 (MISCELLANEOUS) ×2 IMPLANT

## 2018-01-08 NOTE — Anesthesia Preprocedure Evaluation (Signed)
Anesthesia Evaluation  Patient identified by MRN, date of birth, ID band Patient awake    Reviewed: Allergy & Precautions, NPO status , Patient's Chart, lab work & pertinent test results  History of Anesthesia Complications Negative for: history of anesthetic complications  Airway Mallampati: I  TM Distance: >3 FB Neck ROM: Full    Dental  (+) Lower Dentures, Upper Dentures   Pulmonary former smoker (quit 2001),    Pulmonary exam normal breath sounds clear to auscultation       Cardiovascular Exercise Tolerance: Good + Valvular Problems/Murmurs (murmur)  Rhythm:Regular Rate:Normal + Systolic murmurs    Neuro/Psych negative neurological ROS     GI/Hepatic negative GI ROS,   Endo/Other  diabetes (controlled with diet & exercise)Hypothyroidism   Renal/GU negative Renal ROS     Musculoskeletal   Abdominal   Peds  Hematology negative hematology ROS (+)   Anesthesia Other Findings   Reproductive/Obstetrics                             Anesthesia Physical Anesthesia Plan  ASA: II  Anesthesia Plan: MAC   Post-op Pain Management:    Induction: Intravenous  PONV Risk Score and Plan: 2 and Midazolam and TIVA  Airway Management Planned: Natural Airway  Additional Equipment:   Intra-op Plan:   Post-operative Plan:   Informed Consent: I have reviewed the patients History and Physical, chart, labs and discussed the procedure including the risks, benefits and alternatives for the proposed anesthesia with the patient or authorized representative who has indicated his/her understanding and acceptance.     Plan Discussed with: CRNA  Anesthesia Plan Comments:         Anesthesia Quick Evaluation

## 2018-01-08 NOTE — Op Note (Signed)
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   PROCEDURE:  Phacoemulsification with Toric posterior chamber intraocular lens placement of the left eye which required pupil enlargement with the Malyugin device.   LENS:  Implant Name Type Inv. Item Serial No. Manufacturer Lot No. LRB No. Used  AcrySof IQ Toric astigmatism IOL Intraocular Lens  [id number]   Left 1   [id number] 22.0 Toric intraocular lens with 2.25 diopters of cylindrical power with axis orientation at 10 degrees.   ULTRASOUND TIME: 16 % of 1. minutes, 23 seconds.  CDE 13.2   SURGEON:  Wyonia Hough, MD   ANESTHESIA:  Topical with tetracaine drops and 2% Xylocaine jelly, augmented with 1% preservative-free intracameral lidocaine.  COMPLICATIONS:  None.   DESCRIPTION OF PROCEDURE:  The patient was identified in the holding room and transported to the operating suite and placed in the supine position under the operating microscope.  The left eye was identified as the operative eye, and it was prepped and draped in the usual sterile ophthalmic fashion.    A clear-corneal paracentesis incision was made at the 1:30 position.  0.5 ml of preservative-free 1% lidocaine was injected into the anterior chamber. The anterior chamber was filled with Viscoat.  A 2.4 millimeter near clear corneal incision was then made at the 10:30 position.  The Malyugin ring was placed to expand the pupil to 62mm. A cystotome and capsulorrhexis forceps were then used to make a curvilinear capsulorrhexis.  Hydrodissection and hydrodelineation were then performed using balanced salt solution.   Phacoemulsification was then used in stop and chop fashion to remove the lens, nucleus and epinucleus.  The remaining cortex was aspirated using the irrigation and aspiration handpiece.   Provisc viscoelastic was then placed into the capsular bag to distend it for lens placement.  The Verion digital marker was used to align the implant at the intended axis.   A 22.0 diopter lens was then injected into the capsular bag.  It was rotated clockwise until the axis marks on the lens were approximately 15 degrees in the counterclockwise direction to the intended alignment.  The Malyugin was removed.  The viscoelastic was aspirated from the eye using the irrigation aspiration handpiece.  Then, a Koch spatula through the sideport incision was used to rotate the lens in a clockwise direction until the axis markings of the intraocular lens were lined up with the Verion alignment.  Balanced salt solution was then used to hydrate the wounds. Cefuroxime 0.1 ml of a 10mg /ml solution was injected into the anterior chamber for a dose of 1 mg of intracameral antibiotic at the completion of the case.    The eye was noted to have a physiologic pressure and there was no wound leak noted.   Timolol and Brimonidine drops were applied to the eye.  The patient was taken to the recovery room in stable condition having had no complications of anesthesia or surgery.  Gloria Eaton 01/08/2018, 10:47 AM

## 2018-01-08 NOTE — Anesthesia Postprocedure Evaluation (Signed)
Anesthesia Post Note  Patient: Gloria Eaton  Procedure(s) Performed: CATARACT EXTRACTION PHACO AND INTRAOCULAR LENS PLACEMENT (IOC)  COMPLICATED LEFT TORIC LENS (Left Eye)  Patient location during evaluation: PACU Anesthesia Type: MAC Level of consciousness: awake and alert, oriented and patient cooperative Pain management: pain level controlled Vital Signs Assessment: post-procedure vital signs reviewed and stable Respiratory status: spontaneous breathing, nonlabored ventilation and respiratory function stable Cardiovascular status: blood pressure returned to baseline and stable Postop Assessment: adequate PO intake Anesthetic complications: no    Darrin Nipper

## 2018-01-08 NOTE — Anesthesia Procedure Notes (Signed)
Procedure Name: MAC Performed by: Britani Beattie, CRNA Pre-anesthesia Checklist: Patient identified, Emergency Drugs available, Suction available, Timeout performed and Patient being monitored Patient Re-evaluated:Patient Re-evaluated prior to induction Oxygen Delivery Method: Nasal cannula Placement Confirmation: positive ETCO2       

## 2018-01-08 NOTE — H&P (Signed)
The History and Physical notes are on paper, have been signed, and are to be scanned. The patient remains stable and unchanged from the H&P.   Previous H&P reviewed, patient examined, and there are no changes.  Gloria Eaton 01/08/2018 9:49 AM

## 2018-01-08 NOTE — Transfer of Care (Signed)
Immediate Anesthesia Transfer of Care Note  Patient: Gloria Eaton  Procedure(s) Performed: CATARACT EXTRACTION PHACO AND INTRAOCULAR LENS PLACEMENT (IOC)  COMPLICATED LEFT TORIC LENS (Left Eye)  Patient Location: PACU  Anesthesia Type: MAC  Level of Consciousness: awake, alert  and patient cooperative  Airway and Oxygen Therapy: Patient Spontanous Breathing and Patient connected to supplemental oxygen  Post-op Assessment: Post-op Vital signs reviewed, Patient's Cardiovascular Status Stable, Respiratory Function Stable, Patent Airway and No signs of Nausea or vomiting  Post-op Vital Signs: Reviewed and stable  Complications: No apparent anesthesia complications

## 2018-02-04 DIAGNOSIS — E119 Type 2 diabetes mellitus without complications: Secondary | ICD-10-CM | POA: Diagnosis not present

## 2018-02-04 DIAGNOSIS — E7800 Pure hypercholesterolemia, unspecified: Secondary | ICD-10-CM | POA: Diagnosis not present

## 2018-02-04 DIAGNOSIS — Z79899 Other long term (current) drug therapy: Secondary | ICD-10-CM | POA: Diagnosis not present

## 2018-02-04 DIAGNOSIS — I10 Essential (primary) hypertension: Secondary | ICD-10-CM | POA: Diagnosis not present

## 2018-02-04 DIAGNOSIS — E039 Hypothyroidism, unspecified: Secondary | ICD-10-CM | POA: Diagnosis not present

## 2018-06-02 DIAGNOSIS — R05 Cough: Secondary | ICD-10-CM | POA: Diagnosis not present

## 2018-06-02 DIAGNOSIS — J302 Other seasonal allergic rhinitis: Secondary | ICD-10-CM | POA: Diagnosis not present

## 2018-06-02 DIAGNOSIS — R030 Elevated blood-pressure reading, without diagnosis of hypertension: Secondary | ICD-10-CM | POA: Diagnosis not present

## 2018-08-07 ENCOUNTER — Other Ambulatory Visit: Payer: Self-pay | Admitting: Nurse Practitioner

## 2018-08-07 DIAGNOSIS — Z1231 Encounter for screening mammogram for malignant neoplasm of breast: Secondary | ICD-10-CM

## 2018-08-19 ENCOUNTER — Ambulatory Visit
Admission: RE | Admit: 2018-08-19 | Discharge: 2018-08-19 | Disposition: A | Discharge status: Home / Self Care | Payer: Medicare HMO | Source: Ambulatory Visit | Attending: Nurse Practitioner | Admitting: Nurse Practitioner

## 2018-08-19 DIAGNOSIS — Z1231 Encounter for screening mammogram for malignant neoplasm of breast: Secondary | ICD-10-CM | POA: Insufficient documentation

## 2018-08-26 DIAGNOSIS — E119 Type 2 diabetes mellitus without complications: Secondary | ICD-10-CM | POA: Diagnosis not present

## 2018-09-18 DIAGNOSIS — F4321 Adjustment disorder with depressed mood: Secondary | ICD-10-CM | POA: Diagnosis not present

## 2018-10-17 IMAGING — MG MM DIGITAL SCREENING BILAT W/ TOMO W/ CAD
8 series · 8 of 24 positions shown · non-contrast
Comparison: Previous exam(s).

CLINICAL DATA: Screening.

EXAM:
DIGITAL SCREENING BILATERAL MAMMOGRAM WITH TOMO AND CAD

[L CC synth-2D]
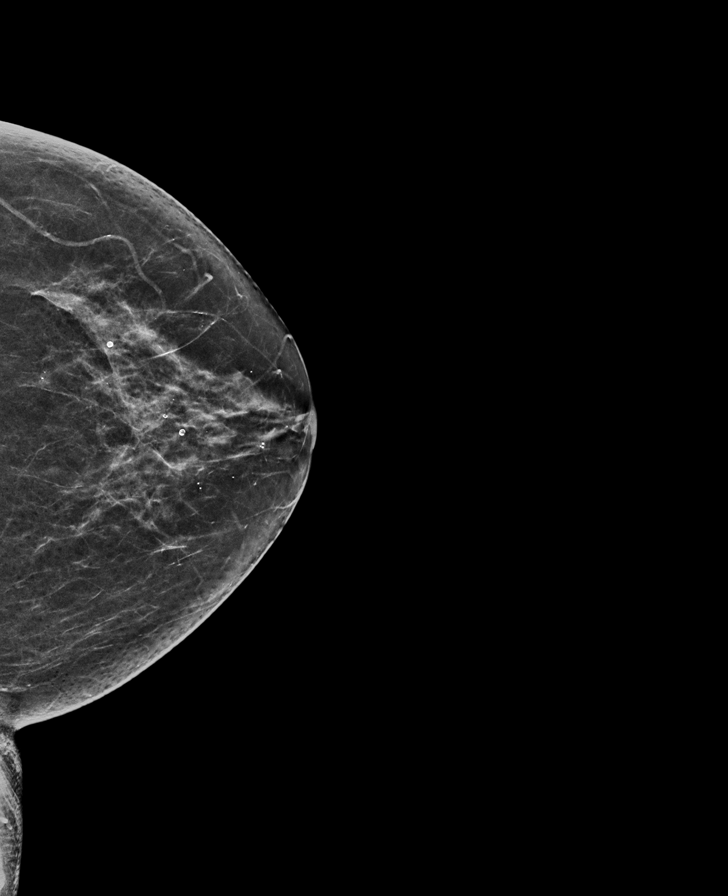

[R MLO synth-2D]
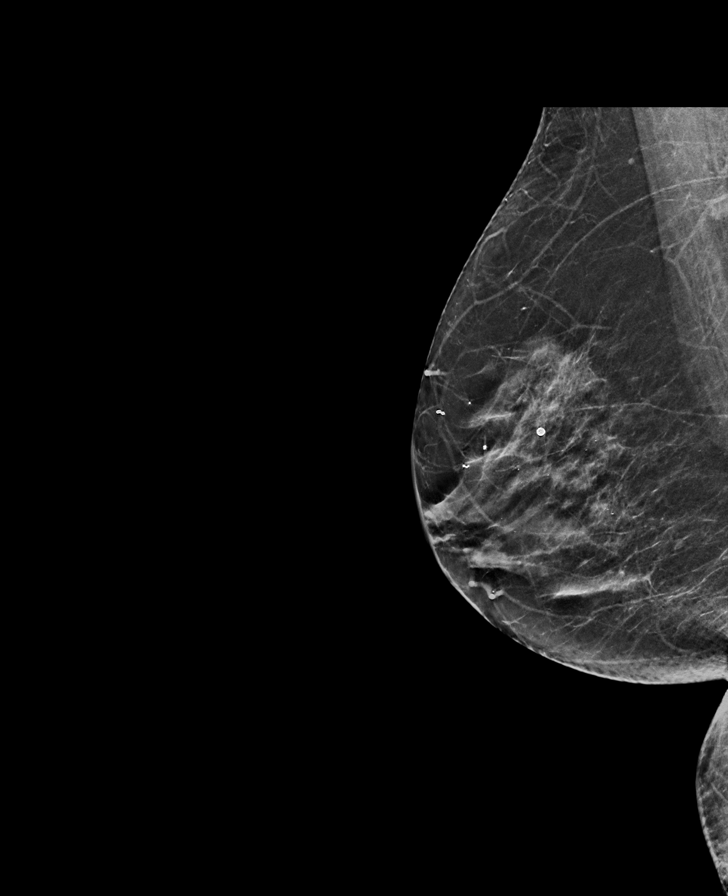

[L MLO synth-2D]
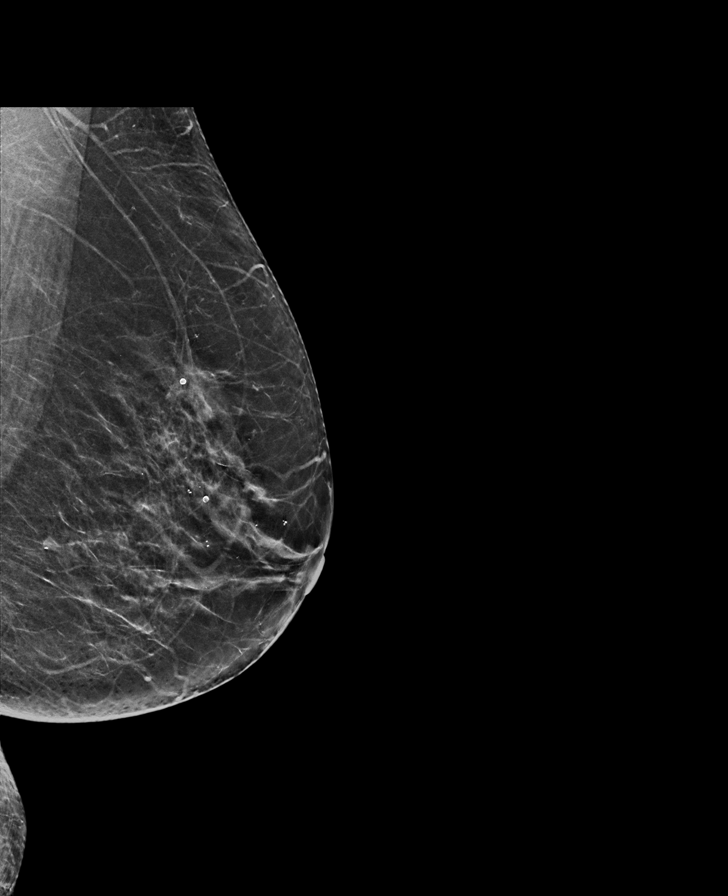

[R CC synth-2D]
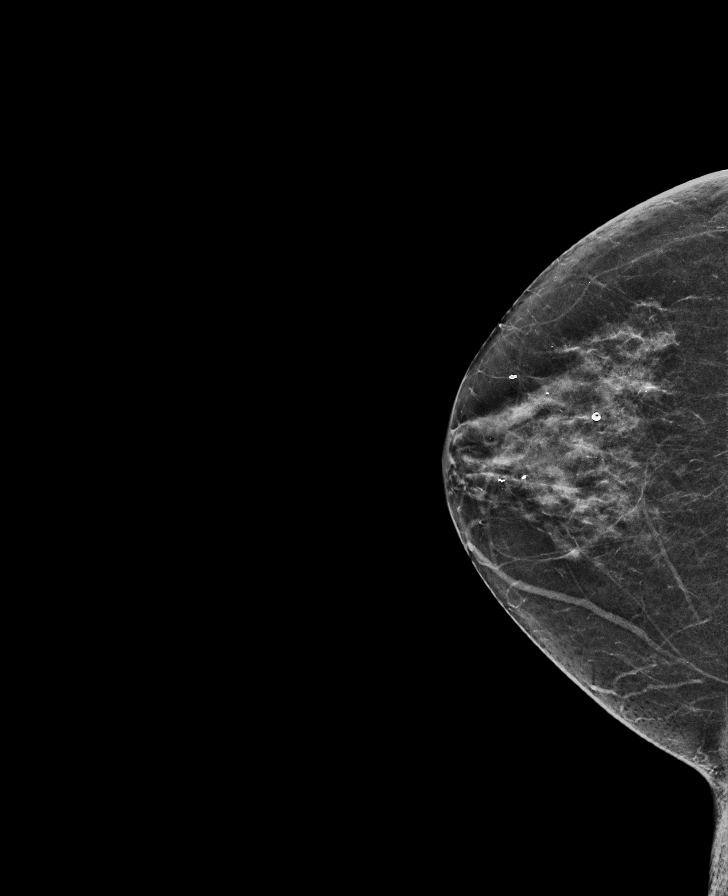

[R CC tomo · tomo slice 29/56.0]
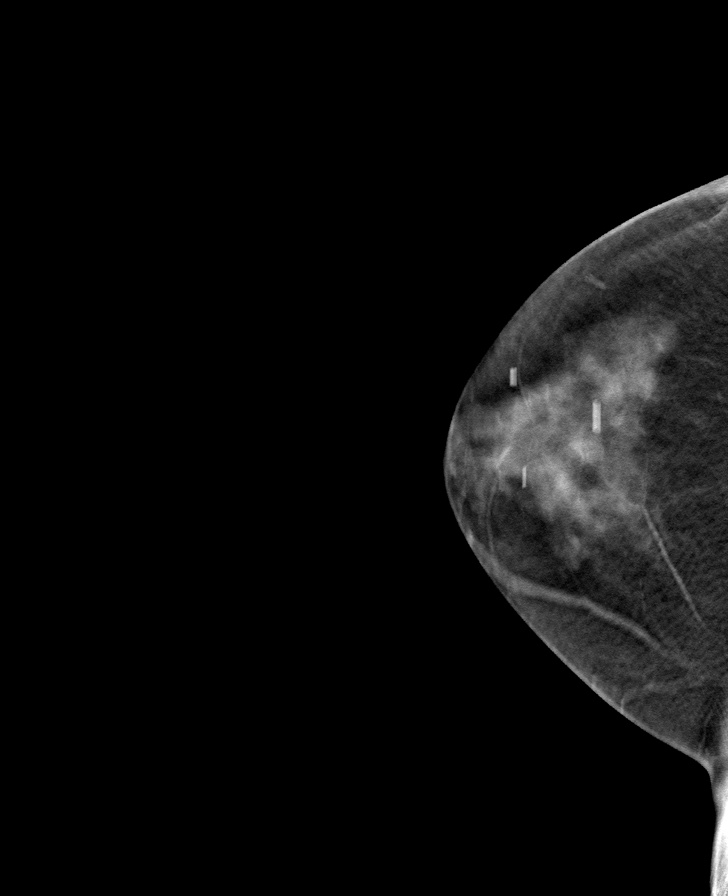

[R MLO tomo · tomo slice 30/59.0]
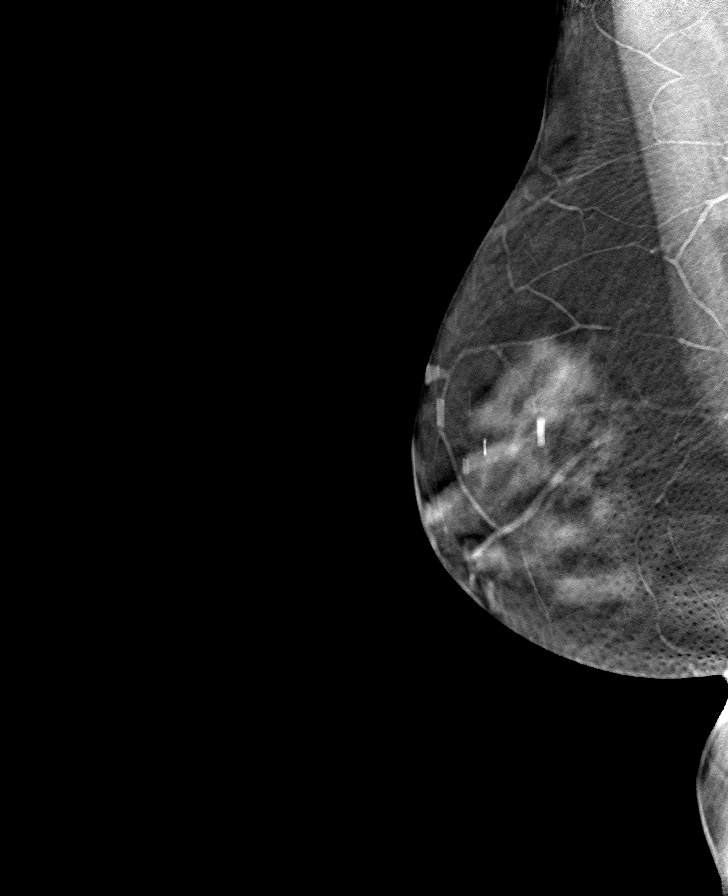

[L CC tomo · tomo slice 31/60.0]
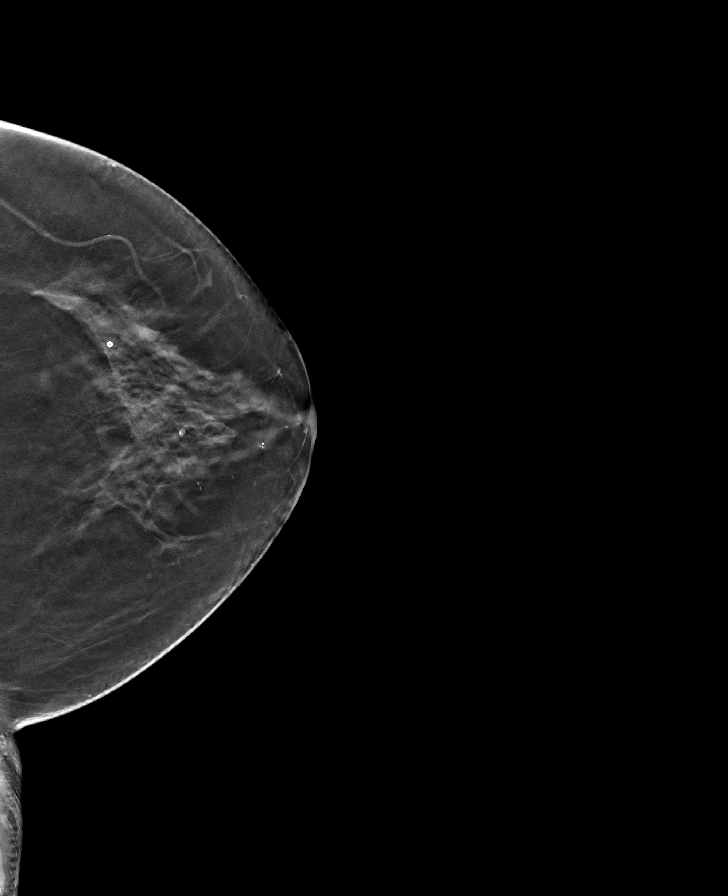

[L MLO tomo · tomo slice 32/63.0]
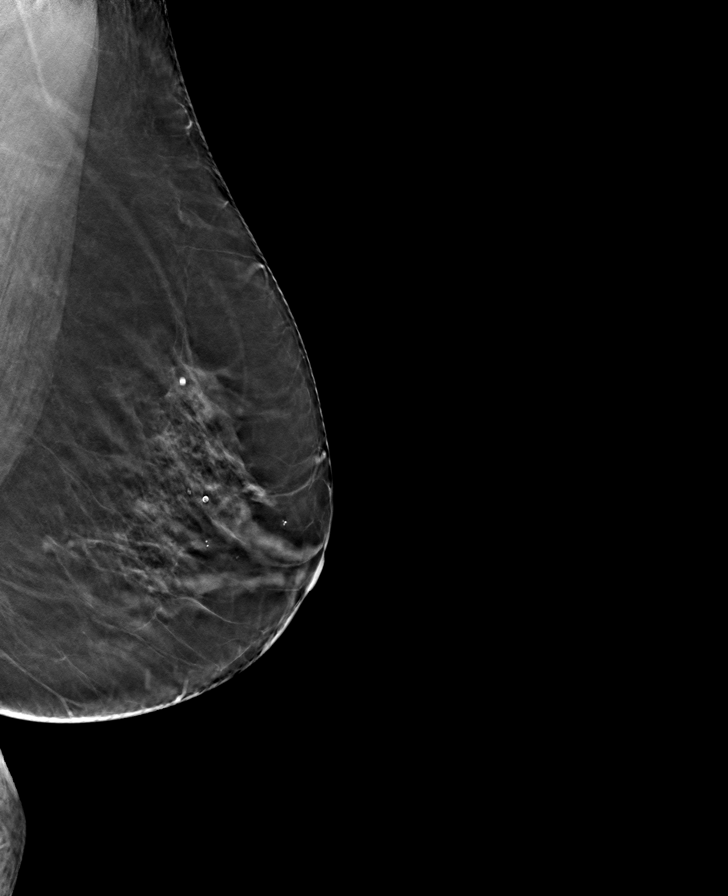

[8 of 24 positions shown; findings below may reference images not displayed]

ACR Breast Density Category b: There are scattered areas of
fibroglandular density.
FINDINGS: There are no findings suspicious for malignancy. Images were
processed with CAD.
IMPRESSION: No mammographic evidence of malignancy. A result letter of this
screening mammogram will be mailed directly to the patient.

RECOMMENDATION:
Screening mammogram in one year. (Code:[id number])

BI-RADS CATEGORY  1: Negative.

## 2018-11-18 DIAGNOSIS — F4321 Adjustment disorder with depressed mood: Secondary | ICD-10-CM | POA: Insufficient documentation

## 2018-11-18 DIAGNOSIS — F4320 Adjustment disorder, unspecified: Secondary | ICD-10-CM | POA: Insufficient documentation

## 2018-11-18 DIAGNOSIS — Z79899 Other long term (current) drug therapy: Secondary | ICD-10-CM | POA: Diagnosis not present

## 2019-02-19 DIAGNOSIS — I10 Essential (primary) hypertension: Secondary | ICD-10-CM | POA: Diagnosis not present

## 2019-02-19 DIAGNOSIS — F4321 Adjustment disorder with depressed mood: Secondary | ICD-10-CM | POA: Diagnosis not present

## 2019-02-19 DIAGNOSIS — E119 Type 2 diabetes mellitus without complications: Secondary | ICD-10-CM | POA: Diagnosis not present

## 2019-02-19 DIAGNOSIS — Z79899 Other long term (current) drug therapy: Secondary | ICD-10-CM | POA: Diagnosis not present

## 2019-02-19 DIAGNOSIS — E7800 Pure hypercholesterolemia, unspecified: Secondary | ICD-10-CM | POA: Diagnosis not present

## 2019-02-19 DIAGNOSIS — E039 Hypothyroidism, unspecified: Secondary | ICD-10-CM | POA: Diagnosis not present

## 2019-02-26 ENCOUNTER — Ambulatory Visit: Payer: Medicare HMO | Admitting: Licensed Clinical Social Worker

## 2019-02-26 DIAGNOSIS — F321 Major depressive disorder, single episode, moderate: Secondary | ICD-10-CM
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
## 2019-03-25 ENCOUNTER — Ambulatory Visit: Payer: Medicare HMO | Admitting: Licensed Clinical Social Worker

## 2019-05-07 ENCOUNTER — Other Ambulatory Visit: Payer: Self-pay

## 2019-05-07 ENCOUNTER — Ambulatory Visit (INDEPENDENT_AMBULATORY_CARE_PROVIDER_SITE_OTHER): Payer: Medicare HMO | Admitting: Licensed Clinical Social Worker

## 2019-05-07 DIAGNOSIS — F321 Major depressive disorder, single episode, moderate: Secondary | ICD-10-CM

## 2019-05-21 NOTE — Progress Notes (Signed)
Virtual Visit via Video Note  I connected with Gloria Eaton on 05/07/19 at  1:00 PM EDT by telemedicine application and verified that I am speaking with the correct person using two identifiers.  Location: Patient: home Provider: office   I discussed the limitations of evaluation and management by telemedicine and the availability of in person appointments. The patient expressed understanding and agreed to proceed.    Participation Level: Active  Type of Therapy: Individual Therapy  Treatment Goals addressed: Coping  Interventions: CBT and Motivational Interviewing  Summary: Gloria Eaton is a 78 y.o. female who presents with continued symptoms of diagnosis.  Therapist met with Patient in an initial therapy session to assess current mood and to build rapport. Therapist engaged Patient in discussion about her life and what is going well for her. Therapist provided support for Patient as she shared details about her life, her current stressors, mood, coping skills, her past, and her children. Therapist prompted Patient to discuss her support system and ways that she manages her daily stress, anger, and frustrations.    LCSW discussed what psychotherapy is and is not and the importance of the therapeutic relationship to include open and honest communication between client and therapist and building trust.  Reviewed advantages and disadvantages of the therapeutic process and limitations to the therapeutic relationship including LCSW's role in maintaining the safety of the client, others and those in client's care.    Suicidal/Homicidal: No  Plan: Return again in 2 weeks.  Diagnosis: Axis I: Depression    Axis II: No diagnosis     I discussed the assessment and treatment plan with the patient. The patient was provided an opportunity to ask questions and all were answered. The patient agreed with the plan and demonstrated an understanding of the instructions.   The patient was advised  to call back or seek an in-person evaluation if the symptoms worsen or if the condition fails to improve as anticipated.  I provided 30 minutes of non-face-to-face time during this encounter.   Lubertha South, LCSW

## 2019-06-08 ENCOUNTER — Ambulatory Visit: Payer: Medicare HMO | Admitting: Licensed Clinical Social Worker

## 2019-06-08 ENCOUNTER — Other Ambulatory Visit: Payer: Self-pay

## 2019-06-30 ENCOUNTER — Other Ambulatory Visit: Payer: Self-pay

## 2019-06-30 ENCOUNTER — Ambulatory Visit: Payer: Medicare HMO | Admitting: Licensed Clinical Social Worker

## 2019-07-01 DIAGNOSIS — M62838 Other muscle spasm: Secondary | ICD-10-CM | POA: Diagnosis not present

## 2019-08-20 ENCOUNTER — Other Ambulatory Visit: Payer: Self-pay | Admitting: Nurse Practitioner

## 2019-08-20 DIAGNOSIS — Z23 Encounter for immunization: Secondary | ICD-10-CM | POA: Diagnosis not present

## 2019-08-20 DIAGNOSIS — Z79899 Other long term (current) drug therapy: Secondary | ICD-10-CM | POA: Diagnosis not present

## 2019-08-20 DIAGNOSIS — Z1159 Encounter for screening for other viral diseases: Secondary | ICD-10-CM | POA: Diagnosis not present

## 2019-08-20 DIAGNOSIS — E119 Type 2 diabetes mellitus without complications: Secondary | ICD-10-CM | POA: Diagnosis not present

## 2019-08-20 DIAGNOSIS — E7800 Pure hypercholesterolemia, unspecified: Secondary | ICD-10-CM | POA: Diagnosis not present

## 2019-08-20 DIAGNOSIS — E039 Hypothyroidism, unspecified: Secondary | ICD-10-CM | POA: Diagnosis not present

## 2019-08-20 DIAGNOSIS — Z1231 Encounter for screening mammogram for malignant neoplasm of breast: Secondary | ICD-10-CM

## 2019-08-20 DIAGNOSIS — I10 Essential (primary) hypertension: Secondary | ICD-10-CM | POA: Diagnosis not present

## 2019-08-20 DIAGNOSIS — Z0000 Encounter for general adult medical examination without abnormal findings: Secondary | ICD-10-CM | POA: Diagnosis not present

## 2019-09-08 ENCOUNTER — Ambulatory Visit
Admission: RE | Admit: 2019-09-08 | Discharge: 2019-09-08 | Disposition: A | Discharge status: Home / Self Care | Payer: Medicare HMO | Source: Ambulatory Visit | Attending: Nurse Practitioner | Admitting: Nurse Practitioner

## 2019-09-08 ENCOUNTER — Other Ambulatory Visit: Payer: Self-pay

## 2019-09-08 ENCOUNTER — Encounter (INDEPENDENT_AMBULATORY_CARE_PROVIDER_SITE_OTHER): Payer: Self-pay

## 2019-09-08 DIAGNOSIS — Z1231 Encounter for screening mammogram for malignant neoplasm of breast: Secondary | ICD-10-CM

## 2019-11-25 DIAGNOSIS — E119 Type 2 diabetes mellitus without complications: Secondary | ICD-10-CM | POA: Diagnosis not present

## 2020-02-24 DIAGNOSIS — E039 Hypothyroidism, unspecified: Secondary | ICD-10-CM | POA: Diagnosis not present

## 2020-02-24 DIAGNOSIS — Z79899 Other long term (current) drug therapy: Secondary | ICD-10-CM | POA: Diagnosis not present

## 2020-02-24 DIAGNOSIS — E7800 Pure hypercholesterolemia, unspecified: Secondary | ICD-10-CM | POA: Diagnosis not present

## 2020-02-24 DIAGNOSIS — I10 Essential (primary) hypertension: Secondary | ICD-10-CM | POA: Diagnosis not present

## 2020-02-24 DIAGNOSIS — F4321 Adjustment disorder with depressed mood: Secondary | ICD-10-CM | POA: Diagnosis not present

## 2020-02-24 DIAGNOSIS — Z87891 Personal history of nicotine dependence: Secondary | ICD-10-CM | POA: Diagnosis not present

## 2020-02-24 DIAGNOSIS — E119 Type 2 diabetes mellitus without complications: Secondary | ICD-10-CM | POA: Diagnosis not present

## 2020-04-22 DIAGNOSIS — A770 Spotted fever due to Rickettsia rickettsii: Secondary | ICD-10-CM | POA: Diagnosis not present

## 2020-04-22 DIAGNOSIS — S80862A Insect bite (nonvenomous), left lower leg, initial encounter: Secondary | ICD-10-CM | POA: Diagnosis not present

## 2020-04-22 DIAGNOSIS — Z9189 Other specified personal risk factors, not elsewhere classified: Secondary | ICD-10-CM | POA: Diagnosis not present

## 2020-04-22 DIAGNOSIS — Z87891 Personal history of nicotine dependence: Secondary | ICD-10-CM | POA: Diagnosis not present

## 2020-04-22 DIAGNOSIS — W57XXXA Bitten or stung by nonvenomous insect and other nonvenomous arthropods, initial encounter: Secondary | ICD-10-CM | POA: Diagnosis not present

## 2020-04-22 DIAGNOSIS — S00262A Insect bite (nonvenomous) of left eyelid and periocular area, initial encounter: Secondary | ICD-10-CM | POA: Diagnosis not present

## 2020-04-22 DIAGNOSIS — S40862A Insect bite (nonvenomous) of left upper arm, initial encounter: Secondary | ICD-10-CM | POA: Diagnosis not present

## 2020-09-27 ENCOUNTER — Other Ambulatory Visit: Payer: Self-pay | Admitting: Nurse Practitioner

## 2020-09-27 DIAGNOSIS — I10 Essential (primary) hypertension: Secondary | ICD-10-CM | POA: Diagnosis not present

## 2020-09-27 DIAGNOSIS — F4321 Adjustment disorder with depressed mood: Secondary | ICD-10-CM | POA: Diagnosis not present

## 2020-09-27 DIAGNOSIS — Z23 Encounter for immunization: Secondary | ICD-10-CM | POA: Diagnosis not present

## 2020-09-27 DIAGNOSIS — K219 Gastro-esophageal reflux disease without esophagitis: Secondary | ICD-10-CM | POA: Diagnosis not present

## 2020-09-27 DIAGNOSIS — E785 Hyperlipidemia, unspecified: Secondary | ICD-10-CM | POA: Diagnosis not present

## 2020-09-27 DIAGNOSIS — E119 Type 2 diabetes mellitus without complications: Secondary | ICD-10-CM | POA: Diagnosis not present

## 2020-09-27 DIAGNOSIS — Z0000 Encounter for general adult medical examination without abnormal findings: Secondary | ICD-10-CM | POA: Diagnosis not present

## 2020-09-27 DIAGNOSIS — Z1231 Encounter for screening mammogram for malignant neoplasm of breast: Secondary | ICD-10-CM

## 2020-09-27 DIAGNOSIS — E039 Hypothyroidism, unspecified: Secondary | ICD-10-CM | POA: Diagnosis not present

## 2020-10-03 DIAGNOSIS — M8588 Other specified disorders of bone density and structure, other site: Secondary | ICD-10-CM | POA: Diagnosis not present

## 2020-10-13 ENCOUNTER — Encounter (INDEPENDENT_AMBULATORY_CARE_PROVIDER_SITE_OTHER): Payer: Self-pay

## 2020-10-13 ENCOUNTER — Ambulatory Visit
Admission: RE | Admit: 2020-10-13 | Discharge: 2020-10-13 | Disposition: A | Discharge status: Home / Self Care | Payer: Medicare HMO | Source: Ambulatory Visit | Attending: Nurse Practitioner | Admitting: Nurse Practitioner

## 2020-10-13 ENCOUNTER — Other Ambulatory Visit: Payer: Self-pay

## 2020-10-13 DIAGNOSIS — Z1231 Encounter for screening mammogram for malignant neoplasm of breast: Secondary | ICD-10-CM

## 2020-10-18 ENCOUNTER — Other Ambulatory Visit: Payer: Self-pay | Admitting: Nurse Practitioner

## 2020-10-18 DIAGNOSIS — R928 Other abnormal and inconclusive findings on diagnostic imaging of breast: Secondary | ICD-10-CM

## 2020-10-18 DIAGNOSIS — N6310 Unspecified lump in the right breast, unspecified quadrant: Secondary | ICD-10-CM

## 2020-10-31 ENCOUNTER — Other Ambulatory Visit: Payer: Self-pay

## 2020-10-31 ENCOUNTER — Ambulatory Visit
Admission: RE | Admit: 2020-10-31 | Discharge: 2020-10-31 | Disposition: A | Discharge status: Home / Self Care | Payer: Medicare HMO | Source: Ambulatory Visit | Attending: Nurse Practitioner | Admitting: Nurse Practitioner

## 2020-10-31 DIAGNOSIS — N6310 Unspecified lump in the right breast, unspecified quadrant: Secondary | ICD-10-CM | POA: Diagnosis not present

## 2020-10-31 DIAGNOSIS — R928 Other abnormal and inconclusive findings on diagnostic imaging of breast: Secondary | ICD-10-CM | POA: Diagnosis not present

## 2020-10-31 DIAGNOSIS — N6001 Solitary cyst of right breast: Secondary | ICD-10-CM | POA: Diagnosis not present

## 2020-11-09 DIAGNOSIS — E039 Hypothyroidism, unspecified: Secondary | ICD-10-CM | POA: Diagnosis not present

## 2020-11-09 DIAGNOSIS — Z79899 Other long term (current) drug therapy: Secondary | ICD-10-CM | POA: Diagnosis not present

## 2021-01-11 DIAGNOSIS — Z20822 Contact with and (suspected) exposure to covid-19: Secondary | ICD-10-CM | POA: Diagnosis not present

## 2021-01-16 DIAGNOSIS — R059 Cough, unspecified: Secondary | ICD-10-CM | POA: Diagnosis not present

## 2021-03-07 DIAGNOSIS — Z0100 Encounter for examination of eyes and vision without abnormal findings: Secondary | ICD-10-CM | POA: Diagnosis not present

## 2021-03-07 DIAGNOSIS — E119 Type 2 diabetes mellitus without complications: Secondary | ICD-10-CM | POA: Diagnosis not present

## 2021-03-21 DIAGNOSIS — Z0100 Encounter for examination of eyes and vision without abnormal findings: Secondary | ICD-10-CM | POA: Diagnosis not present

## 2021-03-29 DIAGNOSIS — E7800 Pure hypercholesterolemia, unspecified: Secondary | ICD-10-CM | POA: Diagnosis not present

## 2021-03-29 DIAGNOSIS — F4321 Adjustment disorder with depressed mood: Secondary | ICD-10-CM | POA: Diagnosis not present

## 2021-03-29 DIAGNOSIS — E119 Type 2 diabetes mellitus without complications: Secondary | ICD-10-CM | POA: Diagnosis not present

## 2021-03-29 DIAGNOSIS — Z79899 Other long term (current) drug therapy: Secondary | ICD-10-CM | POA: Diagnosis not present

## 2021-03-29 DIAGNOSIS — I10 Essential (primary) hypertension: Secondary | ICD-10-CM | POA: Diagnosis not present

## 2021-03-29 DIAGNOSIS — E039 Hypothyroidism, unspecified: Secondary | ICD-10-CM | POA: Diagnosis not present

## 2021-05-12 DIAGNOSIS — Z79899 Other long term (current) drug therapy: Secondary | ICD-10-CM | POA: Diagnosis not present

## 2021-05-12 DIAGNOSIS — E039 Hypothyroidism, unspecified: Secondary | ICD-10-CM | POA: Diagnosis not present

## 2021-06-28 DIAGNOSIS — E039 Hypothyroidism, unspecified: Secondary | ICD-10-CM | POA: Diagnosis not present

## 2021-09-18 DIAGNOSIS — Z03818 Encounter for observation for suspected exposure to other biological agents ruled out: Secondary | ICD-10-CM | POA: Diagnosis not present

## 2021-09-18 DIAGNOSIS — J439 Emphysema, unspecified: Secondary | ICD-10-CM | POA: Diagnosis not present

## 2021-09-18 DIAGNOSIS — J40 Bronchitis, not specified as acute or chronic: Secondary | ICD-10-CM | POA: Diagnosis not present

## 2021-09-18 DIAGNOSIS — R059 Cough, unspecified: Secondary | ICD-10-CM | POA: Diagnosis not present

## 2021-10-03 DIAGNOSIS — F4321 Adjustment disorder with depressed mood: Secondary | ICD-10-CM | POA: Diagnosis not present

## 2021-10-03 DIAGNOSIS — Z79899 Other long term (current) drug therapy: Secondary | ICD-10-CM | POA: Diagnosis not present

## 2021-10-03 DIAGNOSIS — E7800 Pure hypercholesterolemia, unspecified: Secondary | ICD-10-CM | POA: Diagnosis not present

## 2021-10-03 DIAGNOSIS — I10 Essential (primary) hypertension: Secondary | ICD-10-CM | POA: Diagnosis not present

## 2021-10-03 DIAGNOSIS — Z23 Encounter for immunization: Secondary | ICD-10-CM | POA: Diagnosis not present

## 2021-10-03 DIAGNOSIS — Z0000 Encounter for general adult medical examination without abnormal findings: Secondary | ICD-10-CM | POA: Diagnosis not present

## 2021-10-03 DIAGNOSIS — E039 Hypothyroidism, unspecified: Secondary | ICD-10-CM | POA: Diagnosis not present

## 2021-10-03 DIAGNOSIS — E119 Type 2 diabetes mellitus without complications: Secondary | ICD-10-CM | POA: Diagnosis not present

## 2021-10-03 DIAGNOSIS — Z1231 Encounter for screening mammogram for malignant neoplasm of breast: Secondary | ICD-10-CM | POA: Diagnosis not present

## 2021-10-04 ENCOUNTER — Other Ambulatory Visit: Payer: Self-pay | Admitting: Nurse Practitioner

## 2021-10-04 DIAGNOSIS — Z1231 Encounter for screening mammogram for malignant neoplasm of breast: Secondary | ICD-10-CM

## 2021-10-06 DIAGNOSIS — L821 Other seborrheic keratosis: Secondary | ICD-10-CM | POA: Diagnosis not present

## 2021-10-06 DIAGNOSIS — D225 Melanocytic nevi of trunk: Secondary | ICD-10-CM | POA: Diagnosis not present

## 2021-10-06 DIAGNOSIS — D2272 Melanocytic nevi of left lower limb, including hip: Secondary | ICD-10-CM | POA: Diagnosis not present

## 2021-10-06 DIAGNOSIS — D2262 Melanocytic nevi of left upper limb, including shoulder: Secondary | ICD-10-CM | POA: Diagnosis not present

## 2021-10-06 DIAGNOSIS — D2261 Melanocytic nevi of right upper limb, including shoulder: Secondary | ICD-10-CM | POA: Diagnosis not present

## 2021-10-06 DIAGNOSIS — D2271 Melanocytic nevi of right lower limb, including hip: Secondary | ICD-10-CM | POA: Diagnosis not present

## 2021-10-06 DIAGNOSIS — X32XXXA Exposure to sunlight, initial encounter: Secondary | ICD-10-CM | POA: Diagnosis not present

## 2021-10-06 DIAGNOSIS — L570 Actinic keratosis: Secondary | ICD-10-CM | POA: Diagnosis not present

## 2021-10-12 DIAGNOSIS — Z1211 Encounter for screening for malignant neoplasm of colon: Secondary | ICD-10-CM | POA: Diagnosis not present

## 2021-10-20 LAB — EXTERNAL GENERIC LAB PROCEDURE: COLOGUARD: NEGATIVE

## 2021-10-20 LAB — COLOGUARD: COLOGUARD: NEGATIVE

## 2021-12-07 ENCOUNTER — Ambulatory Visit
Admission: RE | Admit: 2021-12-07 | Discharge: 2021-12-07 | Disposition: A | Discharge status: Home / Self Care | Payer: Medicare HMO | Source: Ambulatory Visit | Attending: Nurse Practitioner | Admitting: Nurse Practitioner

## 2021-12-07 ENCOUNTER — Other Ambulatory Visit: Payer: Self-pay

## 2021-12-07 DIAGNOSIS — Z1231 Encounter for screening mammogram for malignant neoplasm of breast: Secondary | ICD-10-CM | POA: Insufficient documentation

## 2022-02-05 DIAGNOSIS — M25551 Pain in right hip: Secondary | ICD-10-CM | POA: Diagnosis not present

## 2022-02-06 DIAGNOSIS — M25551 Pain in right hip: Secondary | ICD-10-CM | POA: Diagnosis not present

## 2022-02-06 DIAGNOSIS — M25561 Pain in right knee: Secondary | ICD-10-CM | POA: Diagnosis not present

## 2022-02-06 DIAGNOSIS — M47816 Spondylosis without myelopathy or radiculopathy, lumbar region: Secondary | ICD-10-CM | POA: Diagnosis not present

## 2022-02-13 ENCOUNTER — Emergency Department: Payer: Medicare HMO

## 2022-02-13 ENCOUNTER — Other Ambulatory Visit: Payer: Self-pay

## 2022-02-13 ENCOUNTER — Emergency Department
Admission: EM | Admit: 2022-02-13 | Discharge: 2022-02-13 | Disposition: A | Discharge status: Home / Self Care | Payer: Medicare HMO | Attending: Emergency Medicine | Admitting: Emergency Medicine

## 2022-02-13 DIAGNOSIS — E039 Hypothyroidism, unspecified: Secondary | ICD-10-CM | POA: Insufficient documentation

## 2022-02-13 DIAGNOSIS — Z859 Personal history of malignant neoplasm, unspecified: Secondary | ICD-10-CM | POA: Diagnosis not present

## 2022-02-13 DIAGNOSIS — S99911A Unspecified injury of right ankle, initial encounter: Secondary | ICD-10-CM | POA: Diagnosis present

## 2022-02-13 DIAGNOSIS — I10 Essential (primary) hypertension: Secondary | ICD-10-CM | POA: Diagnosis not present

## 2022-02-13 DIAGNOSIS — R52 Pain, unspecified: Secondary | ICD-10-CM | POA: Diagnosis not present

## 2022-02-13 DIAGNOSIS — S82851A Displaced trimalleolar fracture of right lower leg, initial encounter for closed fracture: Secondary | ICD-10-CM | POA: Insufficient documentation

## 2022-02-13 DIAGNOSIS — X509XXA Other and unspecified overexertion or strenuous movements or postures, initial encounter: Secondary | ICD-10-CM | POA: Diagnosis not present

## 2022-02-13 DIAGNOSIS — S99919A Unspecified injury of unspecified ankle, initial encounter: Secondary | ICD-10-CM | POA: Diagnosis not present

## 2022-02-13 DIAGNOSIS — S9301XA Subluxation of right ankle joint, initial encounter: Secondary | ICD-10-CM | POA: Diagnosis not present

## 2022-02-13 DIAGNOSIS — R609 Edema, unspecified: Secondary | ICD-10-CM | POA: Diagnosis not present

## 2022-02-13 DIAGNOSIS — M7989 Other specified soft tissue disorders: Secondary | ICD-10-CM | POA: Diagnosis not present

## 2022-02-13 DIAGNOSIS — S82831A Other fracture of upper and lower end of right fibula, initial encounter for closed fracture: Secondary | ICD-10-CM | POA: Diagnosis not present

## 2022-02-13 DIAGNOSIS — S82431A Displaced oblique fracture of shaft of right fibula, initial encounter for closed fracture: Secondary | ICD-10-CM | POA: Diagnosis not present

## 2022-02-13 MED ORDER — MIDAZOLAM HCL 2 MG/2ML IJ SOLN
4.0000 mg | Freq: Once | INTRAMUSCULAR | Status: AC
  Administered 2022-02-13: 2 mg via INTRAVENOUS
  Filled 2022-02-13: qty 4

## 2022-02-13 MED ORDER — OXYCODONE-ACETAMINOPHEN 5-325 MG PO TABS
1.0000 | ORAL_TABLET | ORAL | 0 refills | Status: DC | PRN
Start: 2022-02-13 — End: 2022-02-16

## 2022-02-13 MED ORDER — FENTANYL CITRATE PF 50 MCG/ML IJ SOSY
100.0000 ug | PREFILLED_SYRINGE | Freq: Once | INTRAMUSCULAR | Status: AC
  Administered 2022-02-13: 50 ug via INTRAVENOUS
  Filled 2022-02-13: qty 2

## 2022-02-13 MED ORDER — MIDAZOLAM HCL 2 MG/2ML IJ SOLN
4.0000 mg | Freq: Once | INTRAMUSCULAR | Status: AC
Start: 2022-02-13 — End: 2022-02-13
  Administered 2022-02-13: 4 mg via INTRAVENOUS
  Filled 2022-02-13: qty 4

## 2022-02-13 MED ORDER — HYDROMORPHONE HCL 1 MG/ML IJ SOLN
0.5000 mg | Freq: Once | INTRAMUSCULAR | Status: AC
  Administered 2022-02-13: 0.5 mg via INTRAVENOUS
  Filled 2022-02-13: qty 1

## 2022-02-13 MED ORDER — FENTANYL CITRATE PF 50 MCG/ML IJ SOSY
50.0000 ug | PREFILLED_SYRINGE | Freq: Once | INTRAMUSCULAR | Status: AC
  Administered 2022-02-13: 50 ug via INTRAVENOUS
  Filled 2022-02-13: qty 1

## 2022-02-13 MED ORDER — FLUMAZENIL 0.5 MG/5ML IV SOLN
0.5000 mg | Freq: Once | INTRAVENOUS | Status: DC
  Filled 2022-02-13: qty 5

## 2022-02-13 MED ORDER — OXYCODONE-ACETAMINOPHEN 5-325 MG PO TABS
1.0000 | ORAL_TABLET | Freq: Once | ORAL | Status: DC
  Filled 2022-02-13: qty 1

## 2022-02-13 MED ORDER — NALOXONE HCL 2 MG/2ML IJ SOSY
0.4000 mg | PREFILLED_SYRINGE | Freq: Once | INTRAMUSCULAR | Status: DC
Start: 2022-02-13 — End: 2022-02-14
  Filled 2022-02-13: qty 2

## 2022-02-13 MED ORDER — ONDANSETRON 4 MG PO TBDP: 4.0000 mg | ORAL_TABLET | Freq: Three times a day (TID) | ORAL | 0 refills | Status: AC | PRN

## 2022-02-13 MED ORDER — FENTANYL CITRATE PF 50 MCG/ML IJ SOSY
100.0000 ug | PREFILLED_SYRINGE | Freq: Once | INTRAMUSCULAR | Status: AC
  Administered 2022-02-13: 25 ug via INTRAVENOUS
  Filled 2022-02-13: qty 2

## 2022-02-13 MED ORDER — ONDANSETRON 4 MG PO TBDP
4.0000 mg | ORAL_TABLET | Freq: Once | ORAL | Status: AC
  Administered 2022-02-13: 4 mg via ORAL
  Filled 2022-02-13: qty 1

## 2022-02-13 MED ORDER — OXYCODONE-ACETAMINOPHEN 5-325 MG PO TABS
1.0000 | ORAL_TABLET | Freq: Once | ORAL | Status: AC
  Administered 2022-02-13: 1 via ORAL
  Filled 2022-02-13: qty 1

## 2022-02-13 NOTE — ED Provider Notes (Signed)
Patient brought to main side for reduction of the fracture which is slightly tenting the skin over the medial malleolus.  I discussed the patient with podiatry who will follow-up in the office on Friday give them a call tomorrow.  Oral consent was obtained for sedation and reduction.  Patient sedated with Versed and fentanyl.  Suction was in the room the code cart was in the room I had to nurses and attack with me.  We had Narcan and Romazicon if need be.  We used 50 of fentanyl followed by 2 of Versed followed by 50 fentanyl and 2 more Versed the patient became very sedated we were able to reduce the fracture.  Patient was splinted.  Capillary refill looks good distally.  Patient is moving her toes normally and has normal sensation and capillary refill   Nena Polio, MD 02/13/22 [phone]

## 2022-02-13 NOTE — ED Triage Notes (Signed)
Pt comes with c/o right ankle pain after mechanical fall today. Pt states 7/10 pain.

## 2022-02-13 NOTE — Discharge Instructions (Addendum)
Use the Percocet 1 or 2 pills 4 times a day as needed for pain.  Be careful it can make you woozy do not fall again.  Do not operate any hazardous machinery on them because they can impair your functioning.  They can also make you constipated.  Make sure you are eating plenty of fiber and drinking lots of fluids.  Use the walker to help you get around.  Do not put weight on your foot or ankle.  Return immediately if the pain gets severe or if your toes get blue or cold.  If the Percocet makes you nauseated you can use the Zofran melt on your tongue wafers to help with the nausea.  Dr. Amalia Hailey the podiatrist should call you tomorrow to see how you are doing and set up an appointment for Friday.

## 2022-02-13 NOTE — ED Provider Notes (Signed)
[facility] Provider Note    Event Date/Time   First MD Initiated Contact with Patient 02/13/22 1540     (approximate)   History   Chief Complaint Ankle Pain   HPI Gloria Eaton is a 81 y.o. female, history of hyperlipidemia, cancer, prediabetes, hypothyroidism, presents to the emergency department for evaluation of right-sided ankle pain.  Patient states that she was stepping off of her porch when she rolled her ankle.  Denies head injury or LOC.  Denies any prodromal symptoms.  Denies pain in her thigh, knee, or proximal tibia/fibula.   History Limitations: No limitations      Physical Exam  Triage Vital Signs: ED Triage Vitals [02/13/22 1459]  Enc Vitals Group     BP (!) 175/87     Pulse Rate 93     Resp 18     Temp 98.7 F (37.1 C)     Temp src      SpO2 94 %     Weight      Height      Head Circumference      Peak Flow      Pain Score 7     Pain Loc      Pain Edu?      Excl. in [place]?     Most recent vital signs: Vitals:   02/13/22 1911 02/13/22 2131  BP: 99/68 129/71  Pulse: 94 85  Resp: 17 17  Temp:  98.2 F (36.8 C)  SpO2: 97% 98%    General: Awake, NAD.  CV: Good peripheral perfusion.  Resp: Normal effort.  Abd: Soft, non-tender. No distention.  Neuro: At baseline. No gross neurological deficits. Other: Gross bony deformity along the right ankle.  Diffuse swelling.  Mild skin tenting appreciated over the medial malleolus. Significant tenderness with range of motion.  Pulse, motor, sensation intact.  Patient is not ambulatory.   Physical Exam    ED Results / Procedures / Treatments  Labs (all labs ordered are listed, but only abnormal results are displayed) Labs Reviewed - No data to display   EKG Not applicable.   RADIOLOGY  ED Provider Interpretation: I personally reviewed and interpreted this image, suggestive of trimalleolar fracture  DG Ankle Complete Right  Result Date: 02/13/2022 CLINICAL DATA:  [place]  stepping off for patio today, pain, obvious deformity and swelling EXAM: RIGHT ANKLE - COMPLETE 3+ VIEW COMPARISON:  None FINDINGS: Osseous demineralization. Transverse fracture medial malleolus RIGHT ankle, minimally displaced. Comminuted oblique distal RIGHT fibular diaphyseal and lateral malleolar fracture, displaced. Intra-articular posterior malleolar fracture distal tibia, displaced. Associated soft tissue swelling, greatest laterally. No additional fracture, dislocation, or bone destruction. IMPRESSION: Displaced trimalleolar fractures RIGHT ankle. Electronically Signed   By: Lavonia Dana M.D.   On: 02/13/2022 16:03   CT Ankle Right Wo Contrast  Result Date: 02/13/2022 CLINICAL DATA:  Known trimalleolar fracture EXAM: CT OF THE RIGHT ANKLE WITHOUT CONTRAST TECHNIQUE: Multidetector CT imaging of the right ankle was performed according to the standard protocol. Multiplanar CT image reconstructions were also generated. RADIATION DOSE REDUCTION: This exam was performed according to the departmental dose-optimization program which includes automated exposure control, adjustment of the mA and/or kV according to patient size and/or use of iterative reconstruction technique. COMPARISON:  Plain film from earlier in the same day. FINDINGS: Bones/Joint/Cartilage Oblique fracture is again identified through the distal fibula with lateral displacement of the distal fracture fragment with respect to the proximal shaft. Still a degree of comminution  is noted as well. Posterior malleolar fracture is noted which extends into the articular surface with mild displacement. Medial malleolar fracture is noted with comminution similar to that seen on prior CT. The fracture line extends more superiorly than that appreciated on recent plain film. Some impaction of talus into the distal tibia is noted. Mild posterior subluxation of the talus is noted with respect to the main tibial shaft. No talar fractures are noted. Calcaneus is  within normal limits. Visualized tarsal bones appear intact. Well corticated bony density is noted along the dorsal aspect of the talus consistent with prior fracture and nonunion. Ligaments Suboptimally assessed by CT. Muscles and Tendons Surrounding musculature appears within normal limits. Soft tissues Generalized soft tissue swelling is noted about the ankle joint without focal hematoma. IMPRESSION: Changes consistent with the known trimalleolar fracture with intra-articular involvement the posterior malleolar fracture. Mild impaction of the talus into the distal tibia is seen with mild posterior subluxation. No talar fracture or calcaneal fracture is seen. Electronically Signed   By: Inez Catalina M.D.   On: 02/13/2022 20:11    PROCEDURES:  Critical Care performed: None.  Procedures    MEDICATIONS ORDERED IN ED: Medications  naloxone [facility]) injection 0.4 mg (0.4 mg Intravenous Not Given 02/13/22 1959)  flumazenil (ROMAZICON) injection 0.5 mg (0.5 mg Intravenous Not Given 02/13/22 1959)  oxyCODONE-acetaminophen (PERCOCET/ROXICET) 5-325 MG per tablet 1 tablet (has no administration in time range)  oxyCODONE-acetaminophen (PERCOCET/ROXICET) 5-325 MG per tablet 1 tablet (1 tablet Oral Given 02/13/22 1613)  ondansetron (ZOFRAN-ODT) disintegrating tablet 4 mg (4 mg Oral Given 02/13/22 1613)  fentaNYL (SUBLIMAZE) injection 50 mcg (50 mcg Intravenous Given 02/13/22 1726)  midazolam (VERSED) injection 4 mg (4 mg Intravenous Given by Other 02/13/22 1848)  midazolam (VERSED) injection 4 mg (2 mg Intravenous Given 02/13/22 1858)  fentaNYL (SUBLIMAZE) injection 100 mcg (50 mcg Intravenous Given 02/13/22 1853)  fentaNYL (SUBLIMAZE) injection 100 mcg (25 mcg Intravenous Given 02/13/22 1859)  HYDROmorphone (DILAUDID) injection 0.5 mg (0.5 mg Intravenous Given 02/13/22 1959)  HYDROmorphone (DILAUDID) injection 0.5 mg (0.5 mg Intravenous Given 02/13/22 2017)     IMPRESSION / MDM / ASSESSMENT AND PLAN / ED COURSE   I reviewed the triage vital signs and the nursing notes.                              Gloria Eaton is a 81 y.o. female, history of hyperlipidemia, cancer, prediabetes, hypothyroidism, presents to the emergency department for evaluation of right-sided ankle pain.  Patient states that she was stepping off of her porch when she rolled her ankle.   Differential diagnosis includes, but is not limited to, tibia/fibular fracture, malleolus fracture  ED Course Patient appears well, but in pain.  Patient notably hypertensive at 175/87, otherwise normal vitals.  We will go ahead and provide analgesia with oxycodone/acetaminophen.  Ankle x-ray shows displaced trimalleolar fracture.  Spoke with the on-call podiatrist, Dr. Amalia Hailey, who advised reducing in the emergency department, splinting, and following up in his clinic in the next 2 days.   Patient transferred over to the main emergency department under the care of Dr. Corinna Capra for reduction.      FINAL CLINICAL IMPRESSION(S) / ED DIAGNOSES   Final diagnoses:  Trimalleolar fracture of right ankle, closed, initial encounter     Rx / DC Orders   ED Discharge Orders          Ordered  oxyCODONE-acetaminophen (PERCOCET) 5-325 MG tablet  Every 4 hours PRN        02/13/22 1915    ondansetron (ZOFRAN-ODT) 4 MG disintegrating tablet  Every 8 hours PRN        02/13/22 1915             Note:  This document was prepared using Dragon voice recognition software and may include unintentional dictation errors.   Teodoro Spray, [place] 02/13/22 2208    Nena Polio, MD 02/13/22 [phone]

## 2022-02-13 NOTE — ED Notes (Signed)
First Nurse Note:  Pt to ED via [place] from home for right ankle injury. Per EMS, pt was walking down the steps on the carport and missed the last step, twisting her ankle. Pt is A & O. C/O 7/10 pain, EMS states that pt did not want pain medications. EMS reports swelling above the ankle. Pt is in NAD.

## 2022-02-15 ENCOUNTER — Ambulatory Visit: Payer: Medicare HMO | Admitting: Podiatry

## 2022-02-15 ENCOUNTER — Telehealth: Payer: Self-pay | Admitting: Podiatry

## 2022-02-15 NOTE — Telephone Encounter (Signed)
Patients husband called stating pt is still vomiting. Patients husband was wondering if she could get her medication for the vomiting to be a suppository. Patients husband number [phone]. Pt uses pharmacy [facility].

## 2022-02-16 ENCOUNTER — Encounter: Payer: Self-pay | Admitting: Podiatry

## 2022-02-16 ENCOUNTER — Ambulatory Visit: Payer: Medicare HMO | Admitting: Podiatry

## 2022-02-16 ENCOUNTER — Telehealth: Payer: Self-pay

## 2022-02-16 ENCOUNTER — Other Ambulatory Visit: Payer: Self-pay | Admitting: Podiatry

## 2022-02-16 ENCOUNTER — Ambulatory Visit (INDEPENDENT_AMBULATORY_CARE_PROVIDER_SITE_OTHER): Payer: Medicare HMO

## 2022-02-16 ENCOUNTER — Other Ambulatory Visit: Payer: Self-pay

## 2022-02-16 ENCOUNTER — Telehealth: Payer: Self-pay | Admitting: *Deleted

## 2022-02-16 VITALS — BP 115/69 | HR 88 | Temp 97.4°F

## 2022-02-16 DIAGNOSIS — S82851A Displaced trimalleolar fracture of right lower leg, initial encounter for closed fracture: Secondary | ICD-10-CM

## 2022-02-16 DIAGNOSIS — S99922A Unspecified injury of left foot, initial encounter: Secondary | ICD-10-CM

## 2022-02-16 DIAGNOSIS — S82891A Other fracture of right lower leg, initial encounter for closed fracture: Secondary | ICD-10-CM | POA: Diagnosis not present

## 2022-02-16 MED ORDER — HYDROMORPHONE HCL 4 MG PO TABS: 4.0000 mg | ORAL_TABLET | ORAL | 0 refills | Status: DC | PRN

## 2022-02-16 MED ORDER — PROMETHAZINE HCL 25 MG PO TABS: 25.0000 mg | ORAL_TABLET | Freq: Three times a day (TID) | ORAL | 0 refills | Status: AC | PRN

## 2022-02-16 NOTE — Progress Notes (Signed)
PRN pain and nausea  Spoke with the patient and her husband this morning on the phone.  Patient having severe nausea and vomiting with the Percocet that was prescribed.  She also says that the Zofran 4 mg is not helping.  Pain 8/10 constant.  Recommended to the patient to discontinue the Percocet and Zofran.  Prescription for Dilaudid 4 mg with Phenergan 25 mg sent to the pharmacy today.  Potentially she will not have the nausea and vomiting with the Dilaudid that she does with the opioid classes.  She is coming to the office today at 1030 for her appointment.  [name], DPM [facility]  Dr. [name], DPM    [address], [place] [address]                Office [phone]  Fax [phone]

## 2022-02-16 NOTE — Telephone Encounter (Signed)
Spoke with husband and spouse.

## 2022-02-16 NOTE — Telephone Encounter (Signed)
DOS 02/23/2022  ORIF RT TRIMALLEOLAR ANKLE FX - [id number]  [employer]  The following codes do not require a pre-authorization Created on 02/16/2022 Sex F DOB 1941/08/19 Payer [employer] Membership type Medicare Plan Type MED Plan Year 12/24/2021 - 12/23/9998 Group ID [id number] Service info [id number]

## 2022-02-16 NOTE — Telephone Encounter (Signed)
Marcie Bal w/ [facility] is calling to get clarification on a prescription sent in today(Dilaudid), too high Mme dosage for this patient. Explained that the patient had been to ED, given percocet but was causing nausea and vomiting. She said that since this was the case will go ahead and fill.

## 2022-02-19 ENCOUNTER — Other Ambulatory Visit: Payer: Self-pay | Admitting: Podiatry

## 2022-02-19 ENCOUNTER — Telehealth: Payer: Self-pay | Admitting: Podiatry

## 2022-02-19 DIAGNOSIS — S82891A Other fracture of right lower leg, initial encounter for closed fracture: Secondary | ICD-10-CM

## 2022-02-19 NOTE — Telephone Encounter (Signed)
Spoke with patient on the phone. Dilaudid prescribed caused hallucinations. DC all opiods. - Dr. Amalia Hailey

## 2022-02-19 NOTE — Telephone Encounter (Signed)
Patients husband Gwyndolyn Saxon left a message on the after hours line for Dr Amalia Hailey to please call them at home.

## 2022-02-20 ENCOUNTER — Encounter
Admission: RE | Admit: 2022-02-20 | Discharge: 2022-02-20 | Disposition: A | Discharge status: Home / Self Care | Payer: Medicare HMO | Source: Ambulatory Visit | Attending: Podiatry | Admitting: Podiatry

## 2022-02-20 ENCOUNTER — Other Ambulatory Visit: Payer: Self-pay

## 2022-02-20 HISTORY — DX: Type 2 diabetes mellitus without complications: E11.9

## 2022-02-20 HISTORY — DX: Essential (primary) hypertension: I10

## 2022-02-20 HISTORY — DX: Cortical age-related cataract, unspecified eye: H25.019

## 2022-02-20 HISTORY — DX: Polyp of colon: K63.5

## 2022-02-20 HISTORY — DX: Gastro-esophageal reflux disease without esophagitis: K21.9

## 2022-02-20 NOTE — Patient Instructions (Addendum)
Your procedure is scheduled on: Friday, March 3 Report to the Registration Desk on the 1st floor of the [facility]. To find out your arrival time, please call [phone] between 1PM - 3PM on: Thursday March 2  REMEMBER: Instructions that are not followed completely may result in serious medical risk, up to and including death; or upon the discretion of your surgeon and anesthesiologist your surgery may need to be rescheduled.  Do not eat food after midnight the night before surgery.  No gum chewing, lozengers or hard candies.  You may however, drink CLEAR liquids up to 2 hours before you are scheduled to arrive for your surgery. Do not drink anything within 2 hours of your scheduled arrival time.  Clear liquids include: - water  - apple juice without pulp - gatorade (not RED colors) - black coffee or tea (Do NOT add milk or creamers to the coffee or tea) Do NOT drink anything that is not on this list.  TAKE THESE MEDICATIONS THE MORNING OF SURGERY WITH A SIP OF WATER:  Famotidine (Pepcid) - (take one the night before and one on the morning of surgery - helps to prevent nausea after surgery.) Levothyroxine  One week prior to surgery: Stop Anti-inflammatories (NSAIDS) such as Advil, Aleve, Ibuprofen, Motrin, Naproxen, Naprosyn and Aspirin based products such as Excedrin, Goodys Powder, BC Powder. Stop ANY OVER THE COUNTER supplements until after surgery. You may however, continue to take Tylenol if needed for pain up until the day of surgery.  No Alcohol for 24 hours before or after surgery.  No Smoking including e-cigarettes for 24 hours prior to surgery.  No chewable tobacco products for at least 6 hours prior to surgery.  No nicotine patches on the day of surgery.  Do not use any "recreational" drugs for at least a week prior to your surgery.  Please be advised that the combination of cocaine and anesthesia may have negative outcomes, up to and including death. If you test  positive for cocaine, your surgery will be cancelled.  On the morning of surgery brush your teeth with toothpaste and water, you may rinse your mouth with mouthwash if you wish. Do not swallow any toothpaste or mouthwash.  Use CHG wipes as directed on instruction sheet.  Do not wear jewelry, make-up, hairpins, clips or nail polish.  Do not wear lotions, powders, or perfumes.   Do not shave body from the neck down 48 hours prior to surgery just in case you cut yourself which could leave a site for infection.  Also, freshly shaved skin may become irritated if using the CHG soap.  Contact lenses, hearing aids and dentures may not be worn into surgery.  Do not bring valuables to the hospital. [facility] is not responsible for any missing/lost belongings or valuables.   Notify your doctor if there is any change in your medical condition (cold, fever, infection).  Wear comfortable clothing (specific to your surgery type) to the hospital.  After surgery, you can help prevent lung complications by doing breathing exercises.  Take deep breaths and cough every 1-2 hours. Your doctor may order a device called an Incentive Spirometer to help you take deep breaths.  If you are being discharged the day of surgery, you will not be allowed to drive home. You will need a responsible adult (18 years or older) to drive you home and stay with you that night.   If you are taking public transportation, you will need to have a  responsible adult (18 years or older) with you. Please confirm with your physician that it is acceptable to use public transportation.   Please call the [facility] at [phone] if you have any questions about these instructions.  Surgery Visitation Policy:  Patients undergoing a surgery or procedure may have one family member or support person with them as long as that person is not COVID-19 positive or experiencing its symptoms.  That person may remain in  the waiting area during the procedure and may rotate out with other people.

## 2022-02-20 NOTE — Pre-Procedure Instructions (Signed)
Patient has appointment with PCP Ellison Hughs) tomorrow and will get labs and EKG done at their office. Call to [facility] office to verify to get minimum of CBC, BMP, 12 lead EKG and to fax the EKG to pre-admission testing department. Agreed to comply.

## 2022-02-21 ENCOUNTER — Other Ambulatory Visit: Payer: Medicare HMO

## 2022-02-21 DIAGNOSIS — Z79899 Other long term (current) drug therapy: Secondary | ICD-10-CM | POA: Diagnosis not present

## 2022-02-21 DIAGNOSIS — E119 Type 2 diabetes mellitus without complications: Secondary | ICD-10-CM | POA: Diagnosis not present

## 2022-02-21 DIAGNOSIS — I10 Essential (primary) hypertension: Secondary | ICD-10-CM | POA: Diagnosis not present

## 2022-02-21 DIAGNOSIS — Z01818 Encounter for other preprocedural examination: Secondary | ICD-10-CM | POA: Diagnosis not present

## 2022-02-21 DIAGNOSIS — E039 Hypothyroidism, unspecified: Secondary | ICD-10-CM | POA: Diagnosis not present

## 2022-02-23 ENCOUNTER — Other Ambulatory Visit: Payer: Self-pay

## 2022-02-23 ENCOUNTER — Encounter: Payer: Self-pay | Admitting: Podiatry

## 2022-02-23 ENCOUNTER — Ambulatory Visit: Payer: Medicare HMO | Admitting: Urgent Care

## 2022-02-23 ENCOUNTER — Encounter
Admission: RE | Disposition: A | Discharge status: Home / Self Care | Payer: Self-pay | Source: Ambulatory Visit | Attending: Podiatry

## 2022-02-23 ENCOUNTER — Ambulatory Visit: Payer: Medicare HMO

## 2022-02-23 ENCOUNTER — Ambulatory Visit
Admission: RE | Admit: 2022-02-23 | Discharge: 2022-02-23 | Disposition: A | Discharge status: Home / Self Care | Payer: Medicare HMO | Source: Ambulatory Visit | Attending: Podiatry | Admitting: Podiatry

## 2022-02-23 ENCOUNTER — Other Ambulatory Visit: Payer: Self-pay | Admitting: Podiatry

## 2022-02-23 DIAGNOSIS — E039 Hypothyroidism, unspecified: Secondary | ICD-10-CM | POA: Insufficient documentation

## 2022-02-23 DIAGNOSIS — W108XXA Fall (on) (from) other stairs and steps, initial encounter: Secondary | ICD-10-CM | POA: Diagnosis not present

## 2022-02-23 DIAGNOSIS — K219 Gastro-esophageal reflux disease without esophagitis: Secondary | ICD-10-CM | POA: Insufficient documentation

## 2022-02-23 DIAGNOSIS — Z87891 Personal history of nicotine dependence: Secondary | ICD-10-CM | POA: Diagnosis not present

## 2022-02-23 DIAGNOSIS — S82851A Displaced trimalleolar fracture of right lower leg, initial encounter for closed fracture: Secondary | ICD-10-CM | POA: Diagnosis not present

## 2022-02-23 DIAGNOSIS — Z419 Encounter for procedure for purposes other than remedying health state, unspecified: Secondary | ICD-10-CM

## 2022-02-23 DIAGNOSIS — E785 Hyperlipidemia, unspecified: Secondary | ICD-10-CM | POA: Diagnosis not present

## 2022-02-23 DIAGNOSIS — F32A Depression, unspecified: Secondary | ICD-10-CM | POA: Insufficient documentation

## 2022-02-23 DIAGNOSIS — E119 Type 2 diabetes mellitus without complications: Secondary | ICD-10-CM | POA: Diagnosis not present

## 2022-02-23 DIAGNOSIS — I10 Essential (primary) hypertension: Secondary | ICD-10-CM | POA: Diagnosis not present

## 2022-02-23 DIAGNOSIS — S8291XA Unspecified fracture of right lower leg, initial encounter for closed fracture: Secondary | ICD-10-CM | POA: Diagnosis not present

## 2022-02-23 DIAGNOSIS — S82891D Other fracture of right lower leg, subsequent encounter for closed fracture with routine healing: Secondary | ICD-10-CM | POA: Diagnosis not present

## 2022-02-23 HISTORY — PX: ORIF ANKLE FRACTURE: SHX5408

## 2022-02-23 LAB — GLUCOSE, CAPILLARY
Glucose-Capillary: 117 mg/dL — ABNORMAL HIGH (ref 70–99)
Glucose-Capillary: 156 mg/dL — ABNORMAL HIGH (ref 70–99)

## 2022-02-23 SURGERY — OPEN REDUCTION INTERNAL FIXATION (ORIF) ANKLE FRACTURE
Anesthesia: General | Site: Ankle | Laterality: Right

## 2022-02-23 MED ORDER — FENTANYL CITRATE (PF) 100 MCG/2ML IJ SOLN
INTRAMUSCULAR | Status: AC
  Administered 2022-02-23: 25 ug via INTRAVENOUS
  Filled 2022-02-23: qty 2

## 2022-02-23 MED ORDER — PROPOFOL 10 MG/ML IV BOLUS
INTRAVENOUS | Status: AC
  Filled 2022-02-23: qty 20

## 2022-02-23 MED ORDER — PROPOFOL 10 MG/ML IV BOLUS
INTRAVENOUS | Status: DC | PRN
  Administered 2022-02-23 (×2): 20 mg via INTRAVENOUS
  Administered 2022-02-23: 100 mg via INTRAVENOUS

## 2022-02-23 MED ORDER — ORAL CARE MOUTH RINSE: 15.0000 mL | Freq: Once | OROMUCOSAL | Status: AC

## 2022-02-23 MED ORDER — DEXMEDETOMIDINE (PRECEDEX) IN NS 20 MCG/5ML (4 MCG/ML) IV SYRINGE
PREFILLED_SYRINGE | INTRAVENOUS | Status: DC | PRN
  Administered 2022-02-23: 12 ug via INTRAVENOUS
  Administered 2022-02-23: 8 ug via INTRAVENOUS

## 2022-02-23 MED ORDER — PHENYLEPHRINE 40 MCG/ML (10ML) SYRINGE FOR IV PUSH (FOR BLOOD PRESSURE SUPPORT)
PREFILLED_SYRINGE | INTRAVENOUS | Status: AC
  Filled 2022-02-23: qty 10

## 2022-02-23 MED ORDER — DEXMEDETOMIDINE (PRECEDEX) IN NS 20 MCG/5ML (4 MCG/ML) IV SYRINGE
PREFILLED_SYRINGE | INTRAVENOUS | Status: AC
  Filled 2022-02-23: qty 5

## 2022-02-23 MED ORDER — ACETAMINOPHEN 10 MG/ML IV SOLN
INTRAVENOUS | Status: AC
  Filled 2022-02-23: qty 100

## 2022-02-23 MED ORDER — ONDANSETRON HCL 4 MG/2ML IJ SOLN
INTRAMUSCULAR | Status: DC | PRN
  Administered 2022-02-23: 4 mg via INTRAVENOUS

## 2022-02-23 MED ORDER — CHLORHEXIDINE GLUCONATE 0.12 % MT SOLN
15.0000 mL | Freq: Once | OROMUCOSAL | Status: AC
  Administered 2022-02-23: 15 mL via OROMUCOSAL

## 2022-02-23 MED ORDER — ACETAMINOPHEN 10 MG/ML IV SOLN
INTRAVENOUS | Status: DC | PRN
  Administered 2022-02-23: 1000 mg via INTRAVENOUS

## 2022-02-23 MED ORDER — IBUPROFEN 800 MG PO TABS
800.0000 mg | ORAL_TABLET | Freq: Four times a day (QID) | ORAL | 1 refills | Status: DC | PRN
Start: 2022-02-23 — End: 2022-08-21

## 2022-02-23 MED ORDER — ONDANSETRON HCL 4 MG/2ML IJ SOLN: 4.0000 mg | Freq: Once | INTRAMUSCULAR | Status: DC | PRN

## 2022-02-23 MED ORDER — FENTANYL CITRATE (PF) 100 MCG/2ML IJ SOLN
INTRAMUSCULAR | Status: AC
  Filled 2022-02-23: qty 2

## 2022-02-23 MED ORDER — ACETAMINOPHEN-CODEINE #3 300-30 MG PO TABS: 1.0000 | ORAL_TABLET | ORAL | 0 refills | Status: DC | PRN

## 2022-02-23 MED ORDER — LABETALOL HCL 5 MG/ML IV SOLN
INTRAVENOUS | Status: AC
  Filled 2022-02-23: qty 4

## 2022-02-23 MED ORDER — LIDOCAINE HCL (CARDIAC) PF 100 MG/5ML IV SOSY
PREFILLED_SYRINGE | INTRAVENOUS | Status: DC | PRN
Start: 2022-02-23 — End: 2022-02-23
  Administered 2022-02-23: 60 mg via INTRAVENOUS

## 2022-02-23 MED ORDER — LIDOCAINE HCL (CARDIAC) PF 100 MG/5ML IV SOSY
PREFILLED_SYRINGE | INTRAVENOUS | Status: DC | PRN
Start: 2022-02-23 — End: 2022-02-23

## 2022-02-23 MED ORDER — SEVOFLURANE IN SOLN
RESPIRATORY_TRACT | Status: AC
  Filled 2022-02-23: qty 250

## 2022-02-23 MED ORDER — CEFAZOLIN SODIUM-DEXTROSE 2-4 GM/100ML-% IV SOLN
INTRAVENOUS | Status: AC
  Filled 2022-02-23: qty 100

## 2022-02-23 MED ORDER — LIDOCAINE HCL (PF) 2 % IJ SOLN
INTRAMUSCULAR | Status: AC
  Filled 2022-02-23: qty 5

## 2022-02-23 MED ORDER — FENTANYL CITRATE (PF) 100 MCG/2ML IJ SOLN
INTRAMUSCULAR | Status: AC
Start: 2022-02-23 — End: ?
  Filled 2022-02-23: qty 2

## 2022-02-23 MED ORDER — 0.9 % SODIUM CHLORIDE (POUR BTL) OPTIME
TOPICAL | Status: DC | PRN
Start: 2022-02-23 — End: 2022-02-23
  Administered 2022-02-23: 300 mL

## 2022-02-23 MED ORDER — SODIUM CHLORIDE 0.9 % IV SOLN: INTRAVENOUS | Status: DC

## 2022-02-23 MED ORDER — BUPIVACAINE HCL (PF) 0.5 % IJ SOLN
INTRAMUSCULAR | Status: AC
  Filled 2022-02-23: qty 30

## 2022-02-23 MED ORDER — FENTANYL CITRATE (PF) 100 MCG/2ML IJ SOLN
INTRAMUSCULAR | Status: DC | PRN
  Administered 2022-02-23 (×3): 25 ug via INTRAVENOUS
  Administered 2022-02-23: 50 ug via INTRAVENOUS
  Administered 2022-02-23 (×3): 25 ug via INTRAVENOUS

## 2022-02-23 MED ORDER — CEFAZOLIN SODIUM-DEXTROSE 2-4 GM/100ML-% IV SOLN
2.0000 g | INTRAVENOUS | Status: AC
  Administered 2022-02-23: 2 g via INTRAVENOUS

## 2022-02-23 MED ORDER — FENTANYL CITRATE (PF) 100 MCG/2ML IJ SOLN
25.0000 ug | INTRAMUSCULAR | Status: AC | PRN
  Administered 2022-02-23 (×4): 25 ug via INTRAVENOUS

## 2022-02-23 MED ORDER — BUPIVACAINE HCL (PF) 0.5 % IJ SOLN
INTRAMUSCULAR | Status: DC | PRN
Start: 2022-02-23 — End: 2022-02-23
  Administered 2022-02-23: 20 mL

## 2022-02-23 MED ORDER — PHENYLEPHRINE 40 MCG/ML (10ML) SYRINGE FOR IV PUSH (FOR BLOOD PRESSURE SUPPORT)
PREFILLED_SYRINGE | INTRAVENOUS | Status: DC | PRN
  Administered 2022-02-23 (×3): 80 ug via INTRAVENOUS

## 2022-02-23 MED ORDER — DEXAMETHASONE SODIUM PHOSPHATE 10 MG/ML IJ SOLN
INTRAMUSCULAR | Status: DC | PRN
  Administered 2022-02-23: 5 mg via INTRAVENOUS

## 2022-02-23 MED ORDER — LABETALOL HCL 5 MG/ML IV SOLN
INTRAVENOUS | Status: DC | PRN
  Administered 2022-02-23: 2.5 mg via INTRAVENOUS

## 2022-02-23 MED ORDER — CHLORHEXIDINE GLUCONATE 0.12 % MT SOLN
OROMUCOSAL | Status: AC
  Filled 2022-02-23: qty 15

## 2022-02-23 SURGICAL SUPPLY — 55 items
1.4 KWIRE ×1 IMPLANT
3.0 CANNULATED DRILL BIT ×1 IMPLANT
BIT DRILL 2.5 (BIT) ×1
BIT DRILL 60X2.5XDISP (BIT) IMPLANT
BIT DRILL 60X3.5XDISP (BIT) IMPLANT
BIT DRILL CANN 3.0X60 (BIT) ×1 IMPLANT
BIT DRL 60X2.5XDISP (BIT) ×1
BIT DRL 60X3.5XDISP (BIT) ×1
BNDG COHESIVE 4X5 TAN ST LF (GAUZE/BANDAGES/DRESSINGS) ×2 IMPLANT
BNDG ELASTIC 4X5.8 VLCR NS LF (GAUZE/BANDAGES/DRESSINGS) ×2 IMPLANT
BNDG ESMARK 4X12 TAN STRL LF (GAUZE/BANDAGES/DRESSINGS) ×2 IMPLANT
BNDG GAUZE ELAST 4 BULKY (GAUZE/BANDAGES/DRESSINGS) ×2 IMPLANT
BOOT STEPPER DURA MED (SOFTGOODS) ×1 IMPLANT
CHLORAPREP W/TINT 26 (MISCELLANEOUS) ×2 IMPLANT
DRAPE C-ARM 42X70 (DRAPES) ×2 IMPLANT
DRAPE C-ARMOR (DRAPES) ×2 IMPLANT
DRILL BIT 3.5 (BIT) ×1
ELECT REM PT RETURN 9FT ADLT (ELECTROSURGICAL) ×2
ELECTRODE REM PT RTRN 9FT ADLT (ELECTROSURGICAL) ×1 IMPLANT
GAUZE SPONGE 4X4 12PLY STRL (GAUZE/BANDAGES/DRESSINGS) ×2 IMPLANT
GAUZE XEROFORM 1X8 LF (GAUZE/BANDAGES/DRESSINGS) ×2 IMPLANT
GLOVE SURG ENC MOIS LTX SZ7.5 (GLOVE) ×2 IMPLANT
GLOVE SURG SYN 7.0 (GLOVE) ×2 IMPLANT
GLOVE SURG SYN 7.0 PF PI (GLOVE) ×1 IMPLANT
GOWN STRL REUS W/ TWL XL LVL3 (GOWN DISPOSABLE) ×3 IMPLANT
GOWN STRL REUS W/TWL XL LVL3 (GOWN DISPOSABLE) ×3
KIT TURNOVER KIT A (KITS) ×2 IMPLANT
MANIFOLD NEPTUNE II (INSTRUMENTS) ×2 IMPLANT
NEEDLE HYPO 22GX1.5 SAFETY (NEEDLE) ×2 IMPLANT
NS IRRIG 500ML POUR BTL (IV SOLUTION) ×2 IMPLANT
PACK EXTREMITY ARMC (MISCELLANEOUS) ×2 IMPLANT
PAD ABD DERMACEA PRESS 5X9 (GAUZE/BANDAGES/DRESSINGS) ×2 IMPLANT
PAD PREP 24X41 OB/GYN DISP (PERSONAL CARE ITEMS) ×2 IMPLANT
PIN TEMP FIXATION (PIN) ×2 IMPLANT
PLATE TUBULAR 10H (Plate) ×1 IMPLANT
PUTTY DBX 5CC (Putty) ×1 IMPLANT
SCREW BN FT 26X3.5XST LOPRFL (Screw) IMPLANT
SCREW HEADED 4.0X36 (Screw) ×1 IMPLANT
SCREW HEADED 4.0X46 (Screw) ×1 IMPLANT
SCREW LOCK 3.5X12 (Screw) ×3 IMPLANT
SCREW LOCK FT 12X3.5XST PA (Screw) IMPLANT
SCREW LOCK FT 3.5X14 (Screw) ×4 IMPLANT
SCREW NLCK FT 14X3.5XST LOPRFL (Screw) IMPLANT
SCREW NLOCK 3.5X26 (Screw) ×1 IMPLANT
SCREW NONLOCK 3.5X14 (Screw) ×2 IMPLANT
SPONGE T-LAP 18X18 ~~LOC~~+RFID (SPONGE) ×2 IMPLANT
STAPLER SKIN PROX 35W (STAPLE) ×1 IMPLANT
STOCKINETTE M/LG 89821 (MISCELLANEOUS) ×2 IMPLANT
SUT VIC AB 3-0 SH 27 (SUTURE) ×3
SUT VIC AB 3-0 SH 27X BRD (SUTURE) IMPLANT
SUT VIC AB 4-0 SH 27 (SUTURE) ×3
SUT VIC AB 4-0 SH 27XANBCTRL (SUTURE) IMPLANT
SYR 10ML LL (SYRINGE) ×2 IMPLANT
WATER STERILE IRR 500ML POUR (IV SOLUTION) ×2 IMPLANT
WIRE TROCAR 1.6X150 (WIRE) ×1 IMPLANT

## 2022-02-23 NOTE — H&P (View-Only) (Signed)
HPI: 81 y.o. female presenting today as a new patient referral from the ED for evaluation of bilateral foot and ankle pain.  Patient states that 3 days ago on 02/13/2022 she fell down the steps at her home and injured her right ankle.  She went to the emergency department where she was diagnosed with trimalleolar ankle fracture.  The fracture was reduced and splinting applied.  She presents today for outpatient referral and surgical consultation. Patient states that since the injury she is also been experiencing left foot pain as well.  She states that while at the emergency department they did not evaluate her left foot since most of the attention was placed on her right ankle.  She presents today in a wheelchair with her husband.  She presents for further treatment and evaluation  Past Medical History:  Diagnosis Date   Cancer ([place])    skin ca   Cataract cortical, senile    Colon polyp    Diabetes mellitus type 2, uncomplicated ([place])    Essential hypertension    GERD (gastroesophageal reflux disease)    Heart murmur    followed by PCP   Hyperlipidemia    Hypothyroidism    Osteopenia    Prediabetes    Wears dentures    full upper and lower    Past Surgical History:  Procedure Laterality Date   ABDOMINAL HYSTERECTOMY     BREAST CYST ASPIRATION Right    neg   CATARACT EXTRACTION Right 2012   CATARACT EXTRACTION W/PHACO Left 01/08/2018   Procedure: CATARACT EXTRACTION PHACO AND INTRAOCULAR LENS PLACEMENT ([place])  COMPLICATED LEFT TORIC LENS;  Surgeon: Leandrew Koyanagi, MD;  Location: [place];  Service: Ophthalmology;  Laterality: Left;   COLONOSCOPY  10/30/2010   COLONOSCOPY  10/14/2017   CRANIOPLASTY     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 ;  Surgeon: Corky Mull, MD;  Location: [place];  Service: Orthopedics;  Laterality: Right;    Allergies  Allergen Reactions   Hydromorphone Hcl     "Spaced out", Hallucinations, dropping things for 48 hours   Lisinopril Cough   Percocet [Oxycodone-Acetaminophen] Nausea And Vomiting   Vicodin [Hydrocodone-Acetaminophen] Nausea And Vomiting     Physical Exam: General: The patient is alert and oriented x3 in no acute distress.  Dermatology: Skin is warm, dry and supple bilateral lower extremities. Negative for open lesions or macerations.  No open wounds..  Small superficial stable fracture blister noted to the medial malleolus of the right ankle.  Vascular: Palpable pedal pulses bilaterally. Capillary refill within normal limits.  Negative for any significant edema or erythema.  Neurological: Light touch and protective threshold grossly intact  Musculoskeletal Exam: RT ankle fracture 02/13/2022.  Patient also experiences pain with light palpation to the left foot  Radiographic Exam LT foot 02/16/2022:  Normal osseous mineralization. Joint spaces preserved. No fracture/dislocation/boney destruction.  No cortical irregularities noted.  No fractures identified.  Radiographic exam RT ankle 02/13/2022 [facility] ED:  IMPRESSION: Displaced trimalleolar fractures RIGHT ankle.  CT ANKLE RT WO CONTRAST 02/13/2022 [facility] ED: IMPRESSION: Changes consistent with the known trimalleolar  fracture with intra-articular involvement the posterior malleolar fracture. Mild impaction of the talus into the distal tibia is seen with mild posterior subluxation.   No talar fracture or calcaneal fracture is seen.  Assessment: 1.  Trimalleolar ankle fracture  right; closed, displaced 2.  Left foot sprain   Plan of Care:  1. Patient evaluated. X-Rays and CT reviewed.  2.  Today I discussed with the patient and her husband the need for surgical ORIF to reduce the fracture in order to optimize healing of the right ankle.  The patient and husband both agree.  All possible complications and details of the procedure were explained.  No guarantees were expressed or implied.  All patient questions answered.  The surgery and postoperative course were also explained in detail. 3.  Authorization for surgery was initiated today.  Surgery will consist of open reduction internal fixation right ankle fracture.  Tentatively planned for 02/23/2022, 1 week from today at [facility]. 4.  In the meantime, continue strict nonweightbearing right lower extremity.  The splinting that was applied in the ED was reinforced today.  Only a portion of the splint was removed in order to visualize the skin and then the splint was reinforced and reapplied.  Patient may weight-bear to the left foot as needed 5.  Return to clinic 1 week postop      [name], DPM [facility]  Dr. [name], DPM    [address], [place] [address]                Office [phone]  Fax [phone]

## 2022-02-23 NOTE — Anesthesia Procedure Notes (Signed)
Procedure Name: LMA Insertion ?Date/Time: 02/23/2022 3:27 PM ?Performed by: Jonna Clark, CRNA ?Pre-anesthesia Checklist: Patient identified, Patient being monitored, Timeout performed, Emergency Drugs available and Suction available ?Patient Re-evaluated:Patient Re-evaluated prior to induction ?Oxygen Delivery Method: Circle system utilized ?Preoxygenation: Pre-oxygenation with 100% oxygen ?Induction Type: IV induction ?Ventilation: Mask ventilation without difficulty ?LMA: LMA inserted ?LMA Size: 3.0 ?Tube type: Oral ?Number of attempts: 1 ?Placement Confirmation: positive ETCO2 and breath sounds checked- equal and bilateral ?Tube secured with: Tape ?Dental Injury: Teeth and Oropharynx as per pre-operative assessment  ? ? ? ? ?

## 2022-02-23 NOTE — Interval H&P Note (Signed)
History and Physical Interval Note: ? ?02/23/2022 ?2:09 PM ? ?MALEA Eaton  has presented today for surgery, with the diagnosis of ANKLE FRACTURE RIGHT.  The various methods of treatment have been discussed with the patient and family. After consideration of risks, benefits and other options for treatment, the patient has consented to  Procedure(s): ?OPEN REDUCTION INTERNAL FIXATION (ORIF) ANKLE FRACTURE (Right) as Gloria surgical intervention.  The patient's history has been reviewed, patient examined, no change in status, stable for surgery.  I have reviewed the patient's chart and labs.  Questions were answered to the patient's satisfaction.   ? ? ?[name] ? ? ?

## 2022-02-23 NOTE — Anesthesia Postprocedure Evaluation (Signed)
Anesthesia Post Note ? ?Patient: AASHA DINA ? ?Procedure(s) Performed: OPEN REDUCTION INTERNAL FIXATION (ORIF) ANKLE FRACTURE (Right: Ankle) ? ?Patient location during evaluation: PACU ?Anesthesia Type: General ?Level of consciousness: awake and alert ?Pain management: pain level controlled ?Vital Signs Assessment: post-procedure vital signs reviewed and stable ?Respiratory status: spontaneous breathing, nonlabored ventilation, respiratory function stable and patient connected to nasal cannula oxygen ?Cardiovascular status: blood pressure returned to baseline and stable ?Postop Assessment: no apparent nausea or vomiting ?Anesthetic complications: no ? ? ?No notable events documented. ? ? ?Last Vitals:  ?Vitals:  ? 02/23/22 1840 02/23/22 1845  ?BP: (!) 156/70   ?Pulse: 79 86  ?Resp: 16 (!) 23  ?Temp:    ?SpO2: 93% 94%  ?  ?Last Pain:  ?Vitals:  ? 02/23/22 1845  ?TempSrc:   ?PainSc: 4   ? ? ?  ?  ?  ?  ?  ?  ? ?Leonides Minder ? ? ? ? ?

## 2022-02-23 NOTE — Discharge Instructions (Signed)

## 2022-02-23 NOTE — Op Note (Signed)
? ?OPERATIVE REPORT ?Patient name: Gloria Eaton ?MRN: [id number] ?DOB: [birth year] ? ?DOS: 02/23/22 ? ?Preop Dx: Trimalleolar ankle fracture right ?Postop Dx: same ? ?Procedure:  ?1.  Open reduction internal fixation trimalleolar ankle fracture right ? ?Surgeon: [name] DPM ? ?Anesthesia: General anesthesia ? ?Hemostasis: Thigh tourniquet inflated to a pressure of 368mmHg after esmarch exsanguination  ? ?EBL: 50 mL ?Materials: Stryker 10 hole one third tubular plate x1 with respective locking screws x6.  Solid interfrag cortical screws x3.  4.0 mm cannulated screw x2 ?Injectables: 20 mL 0.5% Marcaine plain ?Pathology: None ? ?Condition: The patient tolerated the procedure and anesthesia well. No complications noted or reported ? ? ?Justification for procedure: The patient is a 81 y.o. female who presents today for surgical correction of trimalleolar ankle fracture right.  The patient was told benefits as well as possible side effects of the surgery. The patient consented for surgical correction. The patient consent form was reviewed. All patient questions were answered. No guarantees were expressed or implied. The patient and the surgeon both signed the patient consent form with the witness present and placed in the patient's chart. ? ? ?Procedure in Detail: The patient was brought to the operating room, placed in the operating table in the supine position at which time an aseptic scrub and drape were performed about the patient's respective lower extremity after anesthesia was induced as described above. Attention was then directed to the surgical area where procedure number one commenced. ? ?Procedure #1: Open reduction internal fixation right trimalleolar ankle fracture ?A 15 cm linear longitudinal skin incision was planned and made over the distal fibula bisecting the fibula.  Incision was carried down to the level of bone and fracture fragment with care taken to cut clamp ligate and retract away all small  neurovascular structures traversing the incision site.  The fracture fragments were identified and soft tissue surrounding the fracture fragments were released from the surrounding bone in order to allow for better reduction and distraction of the fibula back to its normal anatomic length.  Irrigation of the fracture site was performed and the hematoma within the fracture fragments was irrigated or debrided away.  The fracture fragments were reduced and temporarily fixated with a combination of 0.62 K wires and bone reduction forceps.  Reduction was verified by intraoperative x-ray fluoroscopy. ?Interfrag screws were placed across the fracture fragments x3 followed by a 10 hole Stryker one third tubular plate.  All screws and hardware was inserted in the standard AO fashion.  This was verified by intraoperative x-ray fluoroscopy which was satisfactory and near anatomic reduction and satisfactory.   ? ?After appropriate reduction of the distal fibula attention was then directed to the medial malleolus where a 4 cm curvilinear skin incision was planned and made overlying the medial malleolus.  Again care was taken to cut clamp ligate and retract away all small neurovascular structures traversing the incision site as well as identification of the saphenous vein which was identified and retracted and preserved.  The deltoid ligament was released from within the fracture fragment and there were some small portions of fracture fragments that were removed in order to allow for better anatomic reduction.  The medial malleoli are fracture fragment was reduced and a 4.0 mm cannulated screw was placed across the incision site.  This was inserted in standard AO fashion and verified by intraoperative x-ray fluoroscopy.  The fracture fragment of the medial malleolus was in good anatomic position.  DBX bone putty  was utilized to feel the void along the fracture fragment where the fracture fragments were removed.  ? ?Additional  imaging was utilized both preoperatively and intraoperatively to visualize the posterior fragment of the tibia.  The posterior fragment had dropped into a good anatomic position after reduction of the fibula and medial malleolus.  A 4.0 mm cannulated screw was driven from anterior to posterior across the fracture fragment for internal fixation.  This was inserted in the standard AO fashion and verified by intraoperative x-ray fluoroscopy which demonstrated good reduction of the posterior fracture fragment and stabilization.  Final intraoperative x-ray fluoroscopy was achieved and all fractures and hardware was in good alignment and good anatomical position. ? ?Dry sterile compressive dressings were then applied to all previously mentioned incision sites about the patient's lower extremity. The tourniquet which was used for hemostasis was deflated. All normal neurovascular responses including pink color and warmth returned all the digits of patient's lower extremity.  20 mL of 0.5% Marcaine plain was infiltrated in a high ankle block fashion. ? ?The patient was then transferred from the operating room to the recovery room having tolerated the procedure and anesthesia well. All vital signs are stable. After a brief stay in the recovery room the patient was discharged with adequate prescriptions for analgesia. Verbal as well as written instructions were provided for the patient regarding wound care. The patient is to keep the dressings clean dry and intact until they are to follow surgeon Dr. Daylene Katayama in the office upon discharge.  ? ?[name], DPM ?[facility] ? ?Dr. [name], DPM  ?  ?[address]                                       ?[place], [place] [address]                ?Office [phone]  ?Fax [phone] ? ? ?

## 2022-02-23 NOTE — Anesthesia Preprocedure Evaluation (Signed)
Anesthesia Evaluation  ?Patient identified by MRN, date of birth, ID band ?Patient awake ? ? ? ?Reviewed: ?Allergy & Precautions, NPO status , Patient's Chart, lab work & pertinent test results ? ?Airway ?Mallampati: II ? ?TM Distance: >3 FB ?Neck ROM: full ? ? ? Dental ? ?(+) Upper Dentures, Lower Dentures ?  ?Pulmonary ?neg pulmonary ROS, former smoker,  ?  ?Pulmonary exam normal ? ? ? ? ? ? ? Cardiovascular ?Exercise Tolerance: Good ?hypertension, Pt. on medications ?negative cardio ROS ?Normal cardiovascular exam ?Rhythm:Regular Rate:Normal ? ? ?  ?Neuro/Psych ?Depression negative neurological ROS ? negative psych ROS  ? GI/Hepatic ?negative GI ROS, Neg liver ROS, GERD  ,  ?Endo/Other  ?negative endocrine ROSdiabetesHypothyroidism  ? Renal/GU ?  ?negative genitourinary ?  ?Musculoskeletal ? ? Abdominal ?  ?Peds ?negative pediatric ROS ?(+)  Hematology ?negative hematology ROS ?(+)   ?Anesthesia Other Findings ?Past Medical History: ?No date: Cancer [facility]) ?    Comment:  skin ca ?No date: Cataract cortical, senile ?No date: Colon polyp ?No date: Diabetes mellitus type 2, uncomplicated ([place]) ?No date: Essential hypertension ?No date: GERD (gastroesophageal reflux disease) ?No date: Heart murmur ?    Comment:  followed by PCP ?No date: Hyperlipidemia ?No date: Hypothyroidism ?No date: Osteopenia ?No date: Prediabetes ?No date: Wears dentures ?    Comment:  full upper and lower ? ?Past Surgical History: ?No date: ABDOMINAL HYSTERECTOMY ?No date: BREAST CYST ASPIRATION; Right ?    Comment:  neg ?2012: CATARACT EXTRACTION; Right ?01/08/2018: CATARACT EXTRACTION W/PHACO; Left ?    Comment:  Procedure: CATARACT EXTRACTION PHACO AND INTRAOCULAR  ?             LENS PLACEMENT ([place])  COMPLICATED LEFT TORIC LENS;   ?             Surgeon: Leandrew Koyanagi, MD;  Location: [place]  ?             [facility];  Service: Ophthalmology;  Laterality: Left; ?10/30/2010: COLONOSCOPY ?10/14/2017:  COLONOSCOPY ?No date: CRANIOPLASTY ?    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    Comment:  Procedure: EXCISION OF A RIGHT DORSAL CARPAL GANGLION;   ?             Surgeon: Corky Mull, MD;  Location: [place]  ?             [facility];  Service: Orthopedics;  Laterality: Right; ?02/22/2016: SHOULDER ARTHROSCOPY; Right ?    Comment:  Procedure: Extensive arthroscopic debridement,  ?             arthroscopic subscapularis tendon tear, arthroscopic  ?             subacromial decompression, mini-open rotator cuff repair, ?             and mini-open biceps tenodesis, right shoulder. ;   ?             Surgeon: Corky Mull, MD;  Location: [place]  ?             [facility];  Service: Orthopedics;  Laterality: Right; ? ?BMI   ? Body Mass Index: 26.37 kg/m?  ?  ? ? Reproductive/Obstetrics ?negative OB ROS ? ?  ? ? ? ? ? ? ? ? ? ? ? ? ? ?  ?  ? ? ? ? ? ? ? ? ?Anesthesia Physical ?Anesthesia Plan ? ?ASA: 3 ? ?  Anesthesia Plan: Spinal  ? ?Post-op Pain Management:   ? ?Induction:  ? ?PONV Risk Score and Plan:  ? ?Airway Management Planned: Natural Airway ? ?Additional Equipment:  ? ?Intra-op Plan:  ? ?Post-operative Plan:  ? ?Informed Consent: I have reviewed the patients History and Physical, chart, labs and discussed the procedure including the risks, benefits and alternatives for the proposed anesthesia with the patient or authorized representative who has indicated his/her understanding and acceptance.  ? ? ? ?Dental Advisory Given ? ?Plan Discussed with: CRNA and Surgeon ? ?Anesthesia Plan Comments:   ? ? ? ? ? ? ?Anesthesia Quick Evaluation ? ?

## 2022-02-23 NOTE — Progress Notes (Signed)
HPI: 81 y.o. female presenting today as a new patient referral from the ED for evaluation of bilateral foot and ankle pain.  Patient states that 3 days ago on 02/13/2022 she fell down the steps at her home and injured her right ankle.  She went to the emergency department where she was diagnosed with trimalleolar ankle fracture.  The fracture was reduced and splinting applied.  She presents today for outpatient referral and surgical consultation. Patient states that since the injury she is also been experiencing left foot pain as well.  She states that while at the emergency department they did not evaluate her left foot since most of the attention was placed on her right ankle.  She presents today in a wheelchair with her husband.  She presents for further treatment and evaluation  Past Medical History:  Diagnosis Date   Cancer ([place])    skin ca   Cataract cortical, senile    Colon polyp    Diabetes mellitus type 2, uncomplicated ([place])    Essential hypertension    GERD (gastroesophageal reflux disease)    Heart murmur    followed by PCP   Hyperlipidemia    Hypothyroidism    Osteopenia    Prediabetes    Wears dentures    full upper and lower    Past Surgical History:  Procedure Laterality Date   ABDOMINAL HYSTERECTOMY     BREAST CYST ASPIRATION Right    neg   CATARACT EXTRACTION Right 2012   CATARACT EXTRACTION W/PHACO Left 01/08/2018   Procedure: CATARACT EXTRACTION PHACO AND INTRAOCULAR LENS PLACEMENT ([place])  COMPLICATED LEFT TORIC LENS;  Surgeon: Leandrew Koyanagi, MD;  Location: [facility];  Service: Ophthalmology;  Laterality: Left;   COLONOSCOPY  10/30/2010   COLONOSCOPY  10/14/2017   CRANIOPLASTY     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   GANGLION CYST EXCISION Right 10/10/2016   Procedure: EXCISION OF A RIGHT DORSAL CARPAL GANGLION;  Surgeon: Corky Mull, MD;  Location: [facility];  Service: Orthopedics;  Laterality: Right;    SHOULDER ARTHROSCOPY Right 02/22/2016   Procedure: Extensive arthroscopic debridement, arthroscopic subscapularis tendon tear, arthroscopic subacromial decompression, mini-open rotator cuff repair, and mini-open biceps tenodesis, right shoulder. ;  Surgeon: Corky Mull, MD;  Location: [facility];  Service: Orthopedics;  Laterality: Right;    Allergies  Allergen Reactions   Hydromorphone Hcl     "Spaced out", Hallucinations, dropping things for 48 hours   Lisinopril Cough   Percocet [Oxycodone-Acetaminophen] Nausea And Vomiting   Vicodin [Hydrocodone-Acetaminophen] Nausea And Vomiting     Physical Exam: General: The patient is alert and oriented x3 in no acute distress.  Dermatology: Skin is warm, dry and supple bilateral lower extremities. Negative for open lesions or macerations.  No open wounds..  Small superficial stable fracture blister noted to the medial malleolus of the right ankle.  Vascular: Palpable pedal pulses bilaterally. Capillary refill within normal limits.  Negative for any significant edema or erythema.  Neurological: Light touch and protective threshold grossly intact  Musculoskeletal Exam: RT ankle fracture 02/13/2022.  Patient also experiences pain with light palpation to the left foot  Radiographic Exam LT foot 02/16/2022:  Normal osseous mineralization. Joint spaces preserved. No fracture/dislocation/boney destruction.  No cortical irregularities noted.  No fractures identified.  Radiographic exam RT ankle 02/13/2022 [facility] ED:  IMPRESSION: Displaced trimalleolar fractures RIGHT ankle.  CT ANKLE RT WO CONTRAST 02/13/2022 [facility] ED: IMPRESSION: Changes consistent with the known trimalleolar  fracture with intra-articular involvement the posterior malleolar fracture. Mild impaction of the talus into the distal tibia is seen with mild posterior subluxation.   No talar fracture or calcaneal fracture is seen.  Assessment: 1.  Trimalleolar ankle fracture  right; closed, displaced 2.  Left foot sprain   Plan of Care:  1. Patient evaluated. X-Rays and CT reviewed.  2.  Today I discussed with the patient and her husband the need for surgical ORIF to reduce the fracture in order to optimize healing of the right ankle.  The patient and husband both agree.  All possible complications and details of the procedure were explained.  No guarantees were expressed or implied.  All patient questions answered.  The surgery and postoperative course were also explained in detail. 3.  Authorization for surgery was initiated today.  Surgery will consist of open reduction internal fixation right ankle fracture.  Tentatively planned for 02/23/2022, 1 week from today at [facility]. 4.  In the meantime, continue strict nonweightbearing right lower extremity.  The splinting that was applied in the ED was reinforced today.  Only a portion of the splint was removed in order to visualize the skin and then the splint was reinforced and reapplied.  Patient may weight-bear to the left foot as needed 5.  Return to clinic 1 week postop      [name], DPM [facility]  Dr. [name], DPM    [address], [place] [address]                Office [phone]  Fax [phone]

## 2022-02-23 NOTE — Transfer of Care (Signed)
Immediate Anesthesia Transfer of Care Note ? ?Patient: Gloria Eaton ? ?Procedure(s) Performed: OPEN REDUCTION INTERNAL FIXATION (ORIF) ANKLE FRACTURE (Right: Ankle) ? ?Patient Location: PACU ? ?Anesthesia Type:General ? ?Level of Consciousness: sedated ? ?Airway & Oxygen Therapy: Patient Spontanous Breathing and Patient connected to face mask oxygen ? ?Post-op Assessment: Report given to RN and Post -op Vital signs reviewed and stable ? ?Post vital signs: Reviewed ? ?Last Vitals:  ?Vitals Value Taken Time  ?BP    ?Temp    ?Pulse    ?Resp    ?SpO2    ? ? ?Last Pain:  ?Vitals:  ? 02/23/22 1346  ?TempSrc: Temporal  ?PainSc: 0-No pain  ?   ? ?  ? ?Complications: No notable events documented. ?

## 2022-02-23 NOTE — Brief Op Note (Signed)
02/23/2022 ? ?5:46 PM ? ?PATIENT:  Gloria Eaton  81 y.o. female ? ?PRE-OPERATIVE DIAGNOSIS:  ANKLE FRACTURE RIGHT ? ?POST-OPERATIVE DIAGNOSIS:  ANKLE FRACTURE RIGHT ? ?PROCEDURE:  Procedure(s): ?OPEN REDUCTION INTERNAL FIXATION (ORIF) ANKLE FRACTURE (Right) ? ?SURGEON:  Surgeon(s) and Role: ?   [name], DPM - Primary ?   Felipa Furnace, DPM - Assisting ? ?PHYSICIAN ASSISTANT:  ? ?ASSISTANTS: DR. Boneta Lucks DPM  ? ?ANESTHESIA:   general ? ?EBL:  50ML  ? ?BLOOD ADMINISTERED:none ? ?DRAINS: none  ? ?LOCAL MEDICATIONS USED:  MARCAINE    ? ?SPECIMEN:  No Specimen ? ?DISPOSITION OF SPECIMEN:  N/A ? ?COUNTS:  YES ? ?TOURNIQUET:   ?Total Tourniquet Time Documented: ?Thigh (Right) - 110 minutes ?Total: Thigh (Right) - 110 minutes ? ? ?DICTATION: .Dragon Dictation ? ?PLAN OF CARE: Discharge to home after PACU ? ?PATIENT DISPOSITION:  PACU - hemodynamically stable. ?  ?Delay start of Pharmacological VTE agent (>24hrs) due to surgical blood loss or risk of bleeding: not applicable ? ?

## 2022-02-26 ENCOUNTER — Encounter: Payer: Self-pay | Admitting: Podiatry

## 2022-02-27 ENCOUNTER — Other Ambulatory Visit: Payer: Self-pay | Admitting: Podiatry

## 2022-02-27 MED ORDER — OXYCODONE-ACETAMINOPHEN 5-325 MG PO TABS: 1.0000 | ORAL_TABLET | ORAL | 0 refills | Status: DC | PRN

## 2022-03-02 ENCOUNTER — Ambulatory Visit (INDEPENDENT_AMBULATORY_CARE_PROVIDER_SITE_OTHER): Payer: Medicare HMO | Admitting: Podiatry

## 2022-03-02 ENCOUNTER — Ambulatory Visit (INDEPENDENT_AMBULATORY_CARE_PROVIDER_SITE_OTHER): Payer: Medicare HMO

## 2022-03-02 ENCOUNTER — Other Ambulatory Visit: Payer: Self-pay

## 2022-03-02 ENCOUNTER — Encounter: Payer: Self-pay | Admitting: Podiatry

## 2022-03-02 VITALS — BP 163/89 | HR 88 | Temp 97.6°F

## 2022-03-02 DIAGNOSIS — S82851A Displaced trimalleolar fracture of right lower leg, initial encounter for closed fracture: Secondary | ICD-10-CM

## 2022-03-02 DIAGNOSIS — Z9889 Other specified postprocedural states: Secondary | ICD-10-CM

## 2022-03-02 NOTE — Progress Notes (Signed)
? ?  Subjective:  ?Patient presents today status post ORIF RT ankle. DOS: 02/23/2022.  Patient states that she is doing well.  She is only taking Tylenol for the pain.  She has been nonweightbearing and keeping the cam boot in place.  She presents for further treatment and evaluation.  POV #1 ? ?Past Medical History:  ?Diagnosis Date  ? Cancer [facility])   ? skin ca  ? Cataract cortical, senile   ? Colon polyp   ? Diabetes mellitus type 2, uncomplicated ([place])   ? Essential hypertension   ? GERD (gastroesophageal reflux disease)   ? Heart murmur   ? followed by PCP  ? Hyperlipidemia   ? Hypothyroidism   ? Osteopenia   ? Prediabetes   ? Wears dentures   ? full upper and lower  ? ?  ? ?Objective/Physical Exam ?Neurovascular status intact.  Skin incisions appear to be well coapted with staples intact. No sign of infectious process noted. No dehiscence. No active bleeding noted. Moderate edema noted to the surgical extremity with ecchymosis.  There is some red irritation to the anterior aspect of the ankle without skin breakdown ? ?Radiographic Exam:  ?Orthopedic hardware and fractures appear to be stable with routine healing.  Good realignment of the fracture sites and restoration of the ankle ? ?Assessment: ?1. s/p ORIF RT ankle. DOS: 02/23/2022 ? ? ?Plan of Care:  ?1. Patient was evaluated. X-rays reviewed ?2.  Dressings changed.  Clean dry and intact x1 week ?3.  Continue strict nonweightbearing ?4.  Return to clinic in 1 week for dressing change. ? ? ?[name], DPM ?[place] ? ?Dr. [name], DPM  ?  ?[address]                                    ?[place], [place] [address]                ?Office [phone]  ?Fax [phone] ? ? ? ? ? ?

## 2022-03-09 ENCOUNTER — Other Ambulatory Visit: Payer: Self-pay

## 2022-03-09 ENCOUNTER — Encounter: Payer: Self-pay | Admitting: Podiatry

## 2022-03-09 ENCOUNTER — Ambulatory Visit (INDEPENDENT_AMBULATORY_CARE_PROVIDER_SITE_OTHER): Payer: Medicare HMO | Admitting: Podiatry

## 2022-03-09 DIAGNOSIS — Z9889 Other specified postprocedural states: Secondary | ICD-10-CM

## 2022-03-09 NOTE — Progress Notes (Signed)
? ?  Subjective:  ?Patient presents today status post ORIF RT ankle. DOS: 02/23/2022.  Patient continues to do well.  Pain is tolerable.  She has been nonweightbearing and there are no new complaints at this time. ? ?Past Medical History:  ?Diagnosis Date  ? Cancer [facility])   ? skin ca  ? Cataract cortical, senile   ? Colon polyp   ? Diabetes mellitus type 2, uncomplicated ([place])   ? Essential hypertension   ? GERD (gastroesophageal reflux disease)   ? Heart murmur   ? followed by PCP  ? Hyperlipidemia   ? Hypothyroidism   ? Osteopenia   ? Prediabetes   ? Wears dentures   ? full upper and lower  ? ?  ? ?Objective/Physical Exam ?Neurovascular status intact.  Skin incisions appear to be well coapted with staples intact. No sign of infectious process noted. No dehiscence. No active bleeding noted. Moderate edema noted to the surgical extremity with ecchymosis.  There is some red irritation to the anterior aspect of the ankle without skin breakdown ? ?Radiographic Exam 03/02/2022 RT ankle:  ?Orthopedic hardware and fractures appear to be stable with routine healing.  Good realignment of the fracture sites and restoration of the ankle ? ?Assessment: ?1. s/p ORIF RT ankle. DOS: 02/23/2022 ? ? ?Plan of Care:  ?1. Patient was evaluated.  ?2.  Partial staples were removed today ?3.  Dry sterile dressings were reapplied.  Clean dry and intact for 1 additional week ?4.  Continue strict nonweightbearing in the cam boot ?5.  Return to clinic in 1 week for final staple removal ? ?[name], DPM ?[place] ? ?Dr. [name], DPM  ?  ?[address]                                    ?[place], [place] [address]                ?Office [phone]  ?Fax [phone] ? ? ? ? ? ?

## 2022-03-13 DIAGNOSIS — Z0100 Encounter for examination of eyes and vision without abnormal findings: Secondary | ICD-10-CM | POA: Diagnosis not present

## 2022-03-13 DIAGNOSIS — E119 Type 2 diabetes mellitus without complications: Secondary | ICD-10-CM | POA: Diagnosis not present

## 2022-03-16 ENCOUNTER — Ambulatory Visit (INDEPENDENT_AMBULATORY_CARE_PROVIDER_SITE_OTHER): Payer: Medicare HMO | Admitting: Podiatry

## 2022-03-16 ENCOUNTER — Encounter: Payer: Self-pay | Admitting: Podiatry

## 2022-03-16 ENCOUNTER — Other Ambulatory Visit: Payer: Self-pay

## 2022-03-16 DIAGNOSIS — Z9889 Other specified postprocedural states: Secondary | ICD-10-CM

## 2022-03-16 MED ORDER — SILVER SULFADIAZINE 1 % EX CREA: 1.0000 | TOPICAL_CREAM | Freq: Every day | CUTANEOUS | 1 refills | Status: DC

## 2022-03-16 NOTE — Progress Notes (Signed)
? ?  Subjective:  ?Patient presents today status post ORIF RT ankle. DOS: 02/23/2022.  Patient doing well.  She has been nonweightbearing in the cam boot as instructed.  No new complaints at this time ? ?Past Medical History:  ?Diagnosis Date  ? Cancer [facility])   ? skin ca  ? Cataract cortical, senile   ? Colon polyp   ? Diabetes mellitus type 2, uncomplicated ([place])   ? Essential hypertension   ? GERD (gastroesophageal reflux disease)   ? Heart murmur   ? followed by PCP  ? Hyperlipidemia   ? Hypothyroidism   ? Osteopenia   ? Prediabetes   ? Wears dentures   ? full upper and lower  ? ?  ? ?Objective/Physical Exam ?Neurovascular status intact.  Skin incisions appear to be well coapted with staples intact. No sign of infectious process noted. No dehiscence. No active bleeding noted.  Minimal edema noted to the surgical extremity with ecchymosis.  The skin irritation to the anterior aspect of the ankle appears to be resolved.  Overall well-healing surgical foot ? ?Radiographic Exam 03/02/2022 RT ankle:  ?Orthopedic hardware and fractures appear to be stable with routine healing.  Good realignment of the fracture sites and restoration of the ankle ? ?Assessment: ?1. s/p ORIF RT ankle. DOS: 02/23/2022 ? ? ?Plan of Care:  ?1. Patient was evaluated.  ?2.  Staples removed today ?3.  Patient may begin washing and showering and getting the foot wet ?4.  Ace wrap daily.  Continue strict nonweightbearing in the cam boot ?5.  Prescription for Silvadene cream 1% sent to the pharmacy to apply over the incision sites ?6.  Return to clinic in 2 weeks for follow-up x-ray ? ?[name], DPM ?[place] ? ?Dr. [name], DPM  ?  ?[address]                                    ?[place], [place] [address]                ?Office [phone]  ?Fax [phone] ? ? ? ? ? ?

## 2022-03-23 ENCOUNTER — Encounter: Payer: Medicare HMO | Admitting: Podiatry

## 2022-03-29 ENCOUNTER — Ambulatory Visit: Payer: Medicare HMO

## 2022-03-29 ENCOUNTER — Ambulatory Visit (INDEPENDENT_AMBULATORY_CARE_PROVIDER_SITE_OTHER): Payer: Medicare HMO | Admitting: Podiatry

## 2022-03-29 DIAGNOSIS — Z9889 Other specified postprocedural states: Secondary | ICD-10-CM

## 2022-03-29 DIAGNOSIS — S82851A Displaced trimalleolar fracture of right lower leg, initial encounter for closed fracture: Secondary | ICD-10-CM

## 2022-03-29 NOTE — Progress Notes (Signed)
? ?  Subjective:  ?Patient presents today status post ORIF RT ankle. DOS: 02/23/2022.  Patient doing well.  She has been nonweightbearing in the cam boot as instructed.  No new complaints at this time ? ?Past Medical History:  ?Diagnosis Date  ? Cancer [facility])   ? skin ca  ? Cataract cortical, senile   ? Colon polyp   ? Diabetes mellitus type 2, uncomplicated ([employer])   ? Essential hypertension   ? GERD (gastroesophageal reflux disease)   ? Heart murmur   ? followed by PCP  ? Hyperlipidemia   ? Hypothyroidism   ? Osteopenia   ? Prediabetes   ? Wears dentures   ? full upper and lower  ? ?  ? ?Objective/Physical Exam ?Neurovascular status intact.  Skin incisions appear to be well coapted with staples intact. No sign of infectious process noted. No dehiscence. No active bleeding noted.  Minimal edema noted to the surgical extremity with ecchymosis.  The skin irritation to the anterior aspect of the ankle appears to be resolved.  Overall well-healing surgical foot ? ?Radiographic Exam 03/02/2022 RT ankle:  ?Orthopedic hardware and fractures appear to be stable with routine healing.  Good realignment of the fracture sites and restoration of the ankle ? ?Assessment: ?1. s/p ORIF RT ankle. DOS: 02/23/2022 ? ? ?Plan of Care:  ?1. Patient was evaluated.  ?3.  Patient can continue showering the foot. ?4.  Ace wrap daily.  Continue strict nonweightbearing in the cam boot ?5.  Continue applying Silvadene cream over the incision site ?6.  Return to clinic in 3 weeks for follow-up x-ray ? ? ? ? ? ? ?

## 2022-04-03 DIAGNOSIS — M79622 Pain in left upper arm: Secondary | ICD-10-CM | POA: Diagnosis not present

## 2022-04-03 DIAGNOSIS — X503XXA Overexertion from repetitive movements, initial encounter: Secondary | ICD-10-CM | POA: Diagnosis not present

## 2022-04-03 DIAGNOSIS — E039 Hypothyroidism, unspecified: Secondary | ICD-10-CM | POA: Diagnosis not present

## 2022-04-13 DIAGNOSIS — M25511 Pain in right shoulder: Secondary | ICD-10-CM | POA: Diagnosis not present

## 2022-04-16 DIAGNOSIS — M25511 Pain in right shoulder: Secondary | ICD-10-CM | POA: Diagnosis not present

## 2022-04-20 ENCOUNTER — Ambulatory Visit (INDEPENDENT_AMBULATORY_CARE_PROVIDER_SITE_OTHER): Payer: Medicare HMO

## 2022-04-20 ENCOUNTER — Ambulatory Visit (INDEPENDENT_AMBULATORY_CARE_PROVIDER_SITE_OTHER): Payer: Medicare HMO | Admitting: Podiatry

## 2022-04-20 DIAGNOSIS — S82851A Displaced trimalleolar fracture of right lower leg, initial encounter for closed fracture: Secondary | ICD-10-CM

## 2022-04-20 NOTE — Progress Notes (Signed)
? ?  Subjective:  ?Patient presents today status post ORIF RT ankle. DOS: 02/23/2022.  Patient doing well.  She has been nonweightbearing in the cam boot as instructed.  No new complaints at this time ? ?Past Medical History:  ?Diagnosis Date  ? Cancer [facility])   ? skin ca  ? Cataract cortical, senile   ? Colon polyp   ? Diabetes mellitus type 2, uncomplicated ([place])   ? Essential hypertension   ? GERD (gastroesophageal reflux disease)   ? Heart murmur   ? followed by PCP  ? Hyperlipidemia   ? Hypothyroidism   ? Osteopenia   ? Prediabetes   ? Wears dentures   ? full upper and lower  ? ?  ? ?Objective/Physical Exam ?Neurovascular status intact.  Skin incisions healed.  No sign of infectious process noted. No dehiscence. No active bleeding noted.  Minimal edema noted to the surgical extremity with ecchymosis.  Skin incisions healed.  No skin irritations noted. ? ?Radiographic Exam 04/20/2022 RT ankle:  ?Orthopedic hardware and fractures appear to be stable with routine healing.  Good realignment of the fracture sites and restoration of the ankle.  No significant change since last x-rays taken.  The fracture fragment to the medial malleolus does appear to be slowly healing ? ?Assessment: ?1. s/p ORIF RT ankle. DOS: 02/23/2022 ? ? ?Plan of Care:  ?1. Patient was evaluated.  ?2.  Patient may begin weightbearing now in the cam boot.  She is about 2 months postop and x-rays look stable. ?3.  Order placed for physical therapy at [facility].  Weightbearing in the cam boot.  Passive range of motion exercises and strengthening and stability ?4.  Return to clinic in 6 weeks for follow-up x-ray and to perhaps transition the patient out of the cam boot into good supportive shoes and sneakers with possible ankle brace ? ?[name], DPM ?[place] ? ?Dr. [name], DPM  ?  ?[address]                                    ?[place], [place] [address]                ?Office [phone]  ?Fax [phone] ? ? ? ? ? ?

## 2022-04-24 DIAGNOSIS — M19012 Primary osteoarthritis, left shoulder: Secondary | ICD-10-CM | POA: Diagnosis not present

## 2022-05-01 ENCOUNTER — Telehealth: Payer: Self-pay | Admitting: *Deleted

## 2022-05-01 DIAGNOSIS — R2689 Other abnormalities of gait and mobility: Secondary | ICD-10-CM | POA: Diagnosis not present

## 2022-05-01 DIAGNOSIS — M25571 Pain in right ankle and joints of right foot: Secondary | ICD-10-CM | POA: Diagnosis not present

## 2022-05-01 NOTE — Telephone Encounter (Signed)
Patient is calling and wanting to know how many hours a day to wear compression sock, should she continue to wear the boot for next 6 weeks. Please advise. ?

## 2022-05-02 ENCOUNTER — Other Ambulatory Visit: Payer: Self-pay | Admitting: Podiatry

## 2022-05-02 ENCOUNTER — Telehealth: Payer: Self-pay | Admitting: Podiatry

## 2022-05-02 DIAGNOSIS — M25571 Pain in right ankle and joints of right foot: Secondary | ICD-10-CM | POA: Diagnosis not present

## 2022-05-02 DIAGNOSIS — R2689 Other abnormalities of gait and mobility: Secondary | ICD-10-CM | POA: Diagnosis not present

## 2022-05-02 MED ORDER — ACETAMINOPHEN-CODEINE #3 300-30 MG PO TABS: 1.0000 | ORAL_TABLET | ORAL | 0 refills | Status: DC | PRN

## 2022-05-02 NOTE — Progress Notes (Signed)
PRN pain 

## 2022-05-02 NOTE — Telephone Encounter (Signed)
Pt would like medication refill on acetaminophen-codeine (TYLENOL #3) 300-30 MG tablet ? ?[place] [place] [address]  ?[place], [place] [address]  ?Phone:  [phone]  Fax:  [phone]  ? ?Please advise ?

## 2022-05-02 NOTE — Telephone Encounter (Signed)
Notified the patient of physician's instructions/recommendations, verbalized understanding.

## 2022-05-03 NOTE — Telephone Encounter (Signed)
Pt is aware of medication sent to pharmacy. ?

## 2022-05-09 DIAGNOSIS — R2689 Other abnormalities of gait and mobility: Secondary | ICD-10-CM | POA: Diagnosis not present

## 2022-05-09 DIAGNOSIS — M25571 Pain in right ankle and joints of right foot: Secondary | ICD-10-CM | POA: Diagnosis not present

## 2022-05-10 ENCOUNTER — Telehealth: Payer: Self-pay | Admitting: *Deleted

## 2022-05-10 DIAGNOSIS — M25571 Pain in right ankle and joints of right foot: Secondary | ICD-10-CM | POA: Diagnosis not present

## 2022-05-10 DIAGNOSIS — R2689 Other abnormalities of gait and mobility: Secondary | ICD-10-CM | POA: Diagnosis not present

## 2022-05-10 NOTE — Telephone Encounter (Signed)
Patient called but the message needed clarification, returned the call back, no answer, left vmessage for return call.

## 2022-05-14 ENCOUNTER — Telehealth: Payer: Self-pay | Admitting: *Deleted

## 2022-05-14 DIAGNOSIS — M25571 Pain in right ankle and joints of right foot: Secondary | ICD-10-CM | POA: Diagnosis not present

## 2022-05-14 DIAGNOSIS — R2689 Other abnormalities of gait and mobility: Secondary | ICD-10-CM | POA: Diagnosis not present

## 2022-05-14 NOTE — Telephone Encounter (Signed)
Patient is calling because PT([place] w/ [facility]) is requesting information or orders on were to go from here.f/u appointment scheduled 06/09 th.Please advise.

## 2022-05-16 DIAGNOSIS — M25571 Pain in right ankle and joints of right foot: Secondary | ICD-10-CM | POA: Diagnosis not present

## 2022-05-16 DIAGNOSIS — R2689 Other abnormalities of gait and mobility: Secondary | ICD-10-CM | POA: Diagnosis not present

## 2022-05-22 DIAGNOSIS — R2689 Other abnormalities of gait and mobility: Secondary | ICD-10-CM | POA: Diagnosis not present

## 2022-05-22 DIAGNOSIS — M25571 Pain in right ankle and joints of right foot: Secondary | ICD-10-CM | POA: Diagnosis not present

## 2022-05-24 DIAGNOSIS — R2689 Other abnormalities of gait and mobility: Secondary | ICD-10-CM | POA: Diagnosis not present

## 2022-05-24 DIAGNOSIS — M25571 Pain in right ankle and joints of right foot: Secondary | ICD-10-CM | POA: Diagnosis not present

## 2022-05-28 DIAGNOSIS — L538 Other specified erythematous conditions: Secondary | ICD-10-CM | POA: Diagnosis not present

## 2022-05-28 DIAGNOSIS — R2689 Other abnormalities of gait and mobility: Secondary | ICD-10-CM | POA: Diagnosis not present

## 2022-05-28 DIAGNOSIS — L820 Inflamed seborrheic keratosis: Secondary | ICD-10-CM | POA: Diagnosis not present

## 2022-05-28 DIAGNOSIS — M25571 Pain in right ankle and joints of right foot: Secondary | ICD-10-CM | POA: Diagnosis not present

## 2022-05-30 DIAGNOSIS — R2689 Other abnormalities of gait and mobility: Secondary | ICD-10-CM | POA: Diagnosis not present

## 2022-05-30 DIAGNOSIS — M25571 Pain in right ankle and joints of right foot: Secondary | ICD-10-CM | POA: Diagnosis not present

## 2022-06-01 ENCOUNTER — Ambulatory Visit (INDEPENDENT_AMBULATORY_CARE_PROVIDER_SITE_OTHER): Payer: Medicare HMO

## 2022-06-01 ENCOUNTER — Ambulatory Visit (INDEPENDENT_AMBULATORY_CARE_PROVIDER_SITE_OTHER): Payer: Medicare HMO | Admitting: Podiatry

## 2022-06-01 ENCOUNTER — Encounter: Payer: Self-pay | Admitting: Podiatry

## 2022-06-01 DIAGNOSIS — S82851A Displaced trimalleolar fracture of right lower leg, initial encounter for closed fracture: Secondary | ICD-10-CM | POA: Diagnosis not present

## 2022-06-01 DIAGNOSIS — Z9889 Other specified postprocedural states: Secondary | ICD-10-CM

## 2022-06-01 NOTE — Progress Notes (Signed)
   Subjective:  Patient presents today status post ORIF RT ankle. DOS: 02/23/2022.  Patient continues to improve.  She says that she is doing well with physical therapy.  She presents for further treatment and evaluation  Past Medical History:  Diagnosis Date   Cancer ([place])    skin ca   Cataract cortical, senile    Colon polyp    Diabetes mellitus type 2, uncomplicated ([place])    Essential hypertension    GERD (gastroesophageal reflux disease)    Heart murmur    followed by PCP   Hyperlipidemia    Hypothyroidism    Osteopenia    Prediabetes    Wears dentures    full upper and lower      Objective/Physical Exam Neurovascular status intact.  Skin incisions healed.    Minimal edema.  There is some limited range of motion to the ankle joint.  Negative for any significant pain with palpation throughout the ankle joint.  Radiographic Exam 06/01/2022 RT ankle:  Orthopedic hardware and fractures appear to be stable with routine healing.  Good realignment of the fracture sites and restoration of the ankle.  No significant change since last x-rays taken.  Fracture fragment to the medial malleolus stable with no significant change since prior x-rays  Assessment: 1. s/p ORIF RT ankle. DOS: 02/23/2022   Plan of Care:  1. Patient was evaluated.  2.  X-rays reviewed.  I do believe the patient is stable enough now to begin to transition out of the cam boot into good supportive shoes and sneakers 3.  Continue physical therapy.  DC cam boot.  Gait training.  Strength training.  Increase range of motion. 4.  Return to clinic 6 weeks for final follow-up  [name], DPM [facility]  Dr. [name], DPM    [address], [place] [address]                Office [phone]  Fax [phone]

## 2022-06-01 NOTE — Telephone Encounter (Signed)
Patient seen today.  Instructions to physical therapist were written on a prescription pad and provided to the patient to give to the physical therapist.  Discontinue cam boot, gait training, strengthening exercises, range of motion exercises.  Thanks, Dr. Amalia Hailey

## 2022-06-07 DIAGNOSIS — R2689 Other abnormalities of gait and mobility: Secondary | ICD-10-CM | POA: Diagnosis not present

## 2022-06-07 DIAGNOSIS — M25571 Pain in right ankle and joints of right foot: Secondary | ICD-10-CM | POA: Diagnosis not present

## 2022-06-12 DIAGNOSIS — R2689 Other abnormalities of gait and mobility: Secondary | ICD-10-CM | POA: Diagnosis not present

## 2022-06-12 DIAGNOSIS — M25571 Pain in right ankle and joints of right foot: Secondary | ICD-10-CM | POA: Diagnosis not present

## 2022-06-14 DIAGNOSIS — R2689 Other abnormalities of gait and mobility: Secondary | ICD-10-CM | POA: Diagnosis not present

## 2022-06-14 DIAGNOSIS — M25571 Pain in right ankle and joints of right foot: Secondary | ICD-10-CM | POA: Diagnosis not present

## 2022-06-19 DIAGNOSIS — R2689 Other abnormalities of gait and mobility: Secondary | ICD-10-CM | POA: Diagnosis not present

## 2022-06-19 DIAGNOSIS — M25571 Pain in right ankle and joints of right foot: Secondary | ICD-10-CM | POA: Diagnosis not present

## 2022-06-21 DIAGNOSIS — R2689 Other abnormalities of gait and mobility: Secondary | ICD-10-CM | POA: Diagnosis not present

## 2022-06-21 DIAGNOSIS — M25571 Pain in right ankle and joints of right foot: Secondary | ICD-10-CM | POA: Diagnosis not present

## 2022-06-25 DIAGNOSIS — R2689 Other abnormalities of gait and mobility: Secondary | ICD-10-CM | POA: Diagnosis not present

## 2022-06-25 DIAGNOSIS — M25571 Pain in right ankle and joints of right foot: Secondary | ICD-10-CM | POA: Diagnosis not present

## 2022-07-02 DIAGNOSIS — R2689 Other abnormalities of gait and mobility: Secondary | ICD-10-CM | POA: Diagnosis not present

## 2022-07-02 DIAGNOSIS — M25571 Pain in right ankle and joints of right foot: Secondary | ICD-10-CM | POA: Diagnosis not present

## 2022-07-05 DIAGNOSIS — R2689 Other abnormalities of gait and mobility: Secondary | ICD-10-CM | POA: Diagnosis not present

## 2022-07-05 DIAGNOSIS — M25571 Pain in right ankle and joints of right foot: Secondary | ICD-10-CM | POA: Diagnosis not present

## 2022-07-06 ENCOUNTER — Other Ambulatory Visit: Payer: Self-pay | Admitting: Podiatry

## 2022-07-06 NOTE — Telephone Encounter (Signed)
Please advise 

## 2022-07-10 DIAGNOSIS — M25571 Pain in right ankle and joints of right foot: Secondary | ICD-10-CM | POA: Diagnosis not present

## 2022-07-10 DIAGNOSIS — R2689 Other abnormalities of gait and mobility: Secondary | ICD-10-CM | POA: Diagnosis not present

## 2022-07-12 DIAGNOSIS — R2689 Other abnormalities of gait and mobility: Secondary | ICD-10-CM | POA: Diagnosis not present

## 2022-07-12 DIAGNOSIS — M25571 Pain in right ankle and joints of right foot: Secondary | ICD-10-CM | POA: Diagnosis not present

## 2022-07-24 ENCOUNTER — Ambulatory Visit (INDEPENDENT_AMBULATORY_CARE_PROVIDER_SITE_OTHER): Payer: Medicare HMO | Admitting: Podiatry

## 2022-07-24 ENCOUNTER — Ambulatory Visit (INDEPENDENT_AMBULATORY_CARE_PROVIDER_SITE_OTHER): Payer: Medicare HMO

## 2022-07-24 DIAGNOSIS — M19071 Primary osteoarthritis, right ankle and foot: Secondary | ICD-10-CM

## 2022-07-24 NOTE — Progress Notes (Signed)
   Chief Complaint  Patient presents with   Follow-up    Patient is here for follow-up after physical therapy    Subjective:  Patient presents today status post ORIF RT ankle. DOS: 02/23/2022.  Patient states that she has completed physical therapy.  She is doing well and wearing tennis shoes and sneakers now.  No new complaints at this time  Past Medical History:  Diagnosis Date   Cancer ([place])    skin ca   Cataract cortical, senile    Colon polyp    Diabetes mellitus type 2, uncomplicated ([place])    Essential hypertension    GERD (gastroesophageal reflux disease)    Heart murmur    followed by PCP   Hyperlipidemia    Hypothyroidism    Osteopenia    Prediabetes    Wears dentures    full upper and lower      Objective/Physical Exam Neurovascular status intact.  Skin incisions healed.    Minimal edema.  There continues to be some limited range of motion to the ankle joint.  No significant pain with palpation throughout the majority of the ankle joint with exception of the distal tip of the medial malleolus is sensitive with palpation.  Radiographic Exam 07/24/2022 RT ankle:  Orthopedic hardware stable.  Good routine healing of the fracture fragments.  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 s/p ORIF RT ankle. DOS: 02/23/2022   Plan of Care:  1. Patient was evaluated.  2.  Patient has now completed physical therapy.  She is wearing good supportive shoes and sneakers daily.  She may slowly increase activity 3.  Return to clinic 6 weeks for follow-up x-ray  [name], DPM [facility]  Dr. [name], DPM    [address], [place] [address]                Office [phone]  Fax [phone]

## 2022-08-06 DIAGNOSIS — M25512 Pain in left shoulder: Secondary | ICD-10-CM | POA: Diagnosis not present

## 2022-08-06 DIAGNOSIS — E039 Hypothyroidism, unspecified: Secondary | ICD-10-CM | POA: Diagnosis not present

## 2022-08-06 DIAGNOSIS — F4321 Adjustment disorder with depressed mood: Secondary | ICD-10-CM | POA: Diagnosis not present

## 2022-08-06 DIAGNOSIS — Z0000 Encounter for general adult medical examination without abnormal findings: Secondary | ICD-10-CM | POA: Diagnosis not present

## 2022-08-06 DIAGNOSIS — M8580 Other specified disorders of bone density and structure, unspecified site: Secondary | ICD-10-CM | POA: Diagnosis not present

## 2022-08-06 DIAGNOSIS — E119 Type 2 diabetes mellitus without complications: Secondary | ICD-10-CM | POA: Diagnosis not present

## 2022-08-06 DIAGNOSIS — I10 Essential (primary) hypertension: Secondary | ICD-10-CM | POA: Diagnosis not present

## 2022-08-06 DIAGNOSIS — Z79899 Other long term (current) drug therapy: Secondary | ICD-10-CM | POA: Diagnosis not present

## 2022-08-06 DIAGNOSIS — E7800 Pure hypercholesterolemia, unspecified: Secondary | ICD-10-CM | POA: Diagnosis not present

## 2022-08-13 DIAGNOSIS — E7800 Pure hypercholesterolemia, unspecified: Secondary | ICD-10-CM | POA: Diagnosis not present

## 2022-08-13 DIAGNOSIS — E039 Hypothyroidism, unspecified: Secondary | ICD-10-CM | POA: Diagnosis not present

## 2022-08-13 DIAGNOSIS — I10 Essential (primary) hypertension: Secondary | ICD-10-CM | POA: Diagnosis not present

## 2022-08-13 DIAGNOSIS — F4321 Adjustment disorder with depressed mood: Secondary | ICD-10-CM | POA: Diagnosis not present

## 2022-08-13 DIAGNOSIS — Z0000 Encounter for general adult medical examination without abnormal findings: Secondary | ICD-10-CM | POA: Diagnosis not present

## 2022-08-13 DIAGNOSIS — M8580 Other specified disorders of bone density and structure, unspecified site: Secondary | ICD-10-CM | POA: Diagnosis not present

## 2022-08-13 DIAGNOSIS — E119 Type 2 diabetes mellitus without complications: Secondary | ICD-10-CM | POA: Diagnosis not present

## 2022-08-13 DIAGNOSIS — M25512 Pain in left shoulder: Secondary | ICD-10-CM | POA: Diagnosis not present

## 2022-08-13 DIAGNOSIS — Z79899 Other long term (current) drug therapy: Secondary | ICD-10-CM | POA: Diagnosis not present

## 2022-08-21 ENCOUNTER — Other Ambulatory Visit: Payer: Self-pay | Admitting: Podiatry

## 2022-09-13 DIAGNOSIS — M7532 Calcific tendinitis of left shoulder: Secondary | ICD-10-CM | POA: Diagnosis not present

## 2022-09-13 DIAGNOSIS — M25312 Other instability, left shoulder: Secondary | ICD-10-CM | POA: Diagnosis not present

## 2022-09-13 DIAGNOSIS — Z23 Encounter for immunization: Secondary | ICD-10-CM | POA: Diagnosis not present

## 2022-09-13 DIAGNOSIS — E119 Type 2 diabetes mellitus without complications: Secondary | ICD-10-CM | POA: Diagnosis not present

## 2022-09-13 DIAGNOSIS — M7502 Adhesive capsulitis of left shoulder: Secondary | ICD-10-CM | POA: Diagnosis not present

## 2022-10-04 DIAGNOSIS — E119 Type 2 diabetes mellitus without complications: Secondary | ICD-10-CM | POA: Diagnosis not present

## 2022-10-04 DIAGNOSIS — M25312 Other instability, left shoulder: Secondary | ICD-10-CM | POA: Diagnosis not present

## 2022-10-04 DIAGNOSIS — M7502 Adhesive capsulitis of left shoulder: Secondary | ICD-10-CM | POA: Diagnosis not present

## 2022-10-04 DIAGNOSIS — M8588 Other specified disorders of bone density and structure, other site: Secondary | ICD-10-CM | POA: Diagnosis not present

## 2022-10-04 DIAGNOSIS — M7532 Calcific tendinitis of left shoulder: Secondary | ICD-10-CM | POA: Diagnosis not present

## 2022-10-08 DIAGNOSIS — D225 Melanocytic nevi of trunk: Secondary | ICD-10-CM | POA: Diagnosis not present

## 2022-10-08 DIAGNOSIS — D2262 Melanocytic nevi of left upper limb, including shoulder: Secondary | ICD-10-CM | POA: Diagnosis not present

## 2022-10-08 DIAGNOSIS — D2271 Melanocytic nevi of right lower limb, including hip: Secondary | ICD-10-CM | POA: Diagnosis not present

## 2022-10-08 DIAGNOSIS — L821 Other seborrheic keratosis: Secondary | ICD-10-CM | POA: Diagnosis not present

## 2022-10-08 DIAGNOSIS — D2272 Melanocytic nevi of left lower limb, including hip: Secondary | ICD-10-CM | POA: Diagnosis not present

## 2022-10-08 DIAGNOSIS — D2261 Melanocytic nevi of right upper limb, including shoulder: Secondary | ICD-10-CM | POA: Diagnosis not present

## 2022-10-08 DIAGNOSIS — L814 Other melanin hyperpigmentation: Secondary | ICD-10-CM | POA: Diagnosis not present

## 2022-10-08 DIAGNOSIS — R202 Paresthesia of skin: Secondary | ICD-10-CM | POA: Diagnosis not present

## 2022-10-09 ENCOUNTER — Other Ambulatory Visit: Payer: Self-pay | Admitting: Nurse Practitioner

## 2022-10-09 DIAGNOSIS — Z1231 Encounter for screening mammogram for malignant neoplasm of breast: Secondary | ICD-10-CM

## 2022-12-06 ENCOUNTER — Telehealth: Payer: Self-pay | Admitting: *Deleted

## 2022-12-06 MED ORDER — IBUPROFEN 800 MG PO TABS: 800.0000 mg | ORAL_TABLET | Freq: Four times a day (QID) | ORAL | 0 refills | Status: AC | PRN

## 2022-12-06 NOTE — Telephone Encounter (Signed)
On behalf of the patient, [place] sent a refill request for Ibuprofen 800 mg.  Dr. Trinidad Curet the refill.

## 2022-12-10 ENCOUNTER — Ambulatory Visit
Admission: RE | Admit: 2022-12-10 | Discharge: 2022-12-10 | Disposition: A | Discharge status: Home / Self Care | Payer: Medicare HMO | Source: Ambulatory Visit | Attending: Nurse Practitioner | Admitting: Nurse Practitioner

## 2022-12-10 DIAGNOSIS — Z1231 Encounter for screening mammogram for malignant neoplasm of breast: Secondary | ICD-10-CM | POA: Insufficient documentation

## 2023-02-06 DIAGNOSIS — I10 Essential (primary) hypertension: Secondary | ICD-10-CM | POA: Diagnosis not present

## 2023-02-06 DIAGNOSIS — E119 Type 2 diabetes mellitus without complications: Secondary | ICD-10-CM | POA: Diagnosis not present

## 2023-02-06 DIAGNOSIS — E039 Hypothyroidism, unspecified: Secondary | ICD-10-CM | POA: Diagnosis not present

## 2023-02-06 DIAGNOSIS — F4321 Adjustment disorder with depressed mood: Secondary | ICD-10-CM | POA: Diagnosis not present

## 2023-02-06 DIAGNOSIS — Z79899 Other long term (current) drug therapy: Secondary | ICD-10-CM | POA: Diagnosis not present

## 2023-02-06 DIAGNOSIS — E7800 Pure hypercholesterolemia, unspecified: Secondary | ICD-10-CM | POA: Diagnosis not present

## 2023-02-06 DIAGNOSIS — M8580 Other specified disorders of bone density and structure, unspecified site: Secondary | ICD-10-CM | POA: Diagnosis not present

## 2023-02-06 DIAGNOSIS — Z8781 Personal history of (healed) traumatic fracture: Secondary | ICD-10-CM | POA: Diagnosis not present

## 2023-02-06 DIAGNOSIS — K219 Gastro-esophageal reflux disease without esophagitis: Secondary | ICD-10-CM | POA: Diagnosis not present

## 2023-02-08 DIAGNOSIS — E119 Type 2 diabetes mellitus without complications: Secondary | ICD-10-CM | POA: Diagnosis not present

## 2023-02-08 DIAGNOSIS — M7502 Adhesive capsulitis of left shoulder: Secondary | ICD-10-CM | POA: Diagnosis not present

## 2023-02-08 DIAGNOSIS — M7532 Calcific tendinitis of left shoulder: Secondary | ICD-10-CM | POA: Diagnosis not present

## 2023-02-19 ENCOUNTER — Encounter: Payer: Self-pay | Admitting: Podiatry

## 2023-02-19 ENCOUNTER — Ambulatory Visit (INDEPENDENT_AMBULATORY_CARE_PROVIDER_SITE_OTHER): Payer: Medicare HMO

## 2023-02-19 ENCOUNTER — Ambulatory Visit: Payer: Medicare HMO | Admitting: Podiatry

## 2023-02-19 DIAGNOSIS — T8484XA Pain due to internal orthopedic prosthetic devices, implants and grafts, initial encounter: Secondary | ICD-10-CM

## 2023-02-19 DIAGNOSIS — M19071 Primary osteoarthritis, right ankle and foot: Secondary | ICD-10-CM | POA: Diagnosis not present

## 2023-02-19 DIAGNOSIS — S82851A Displaced trimalleolar fracture of right lower leg, initial encounter for closed fracture: Secondary | ICD-10-CM

## 2023-02-19 NOTE — Progress Notes (Signed)
Chief Complaint  Patient presents with   Routine Post Op    "It's doing okay, still not where I'd like it to be.  I want him to look at my toe, my toenail fell off."    Subjective:  Patient presents today status post ORIF RT ankle. DOS: 02/23/2022.  Patient states that she does have significant pain and tenderness to the medial aspect of the right ankle just distal to the medial malleolus.  She is concerned for possible pain associated to the medial malleolus.  To this area.  She says that she cannot wear certain shoes because it irritates this area.  Patient also explains that she does have some numbness to the digits 3, 4, 5 of the right foot.  No imbalance or loss of muscle tone or strength to the toes but simple numbness.  No other complaints regarding the ankle today..  She is otherwise wearing good supportive tennis shoes on a daily basis  Past Medical History:  Diagnosis Date   Cancer ([place])    skin ca   Cataract cortical, senile    Colon polyp    Diabetes mellitus type 2, uncomplicated ([place])    Essential hypertension    GERD (gastroesophageal reflux disease)    Heart murmur    followed by PCP   Hyperlipidemia    Hypothyroidism    Osteopenia    Prediabetes    Wears dentures    full upper and lower      Objective/Physical Exam Neurovascular status intact.  Skin incisions healed.    Minimal edema.  Limited range of motion to the ankle joint right.  There is tenderness with palpation which correlates directly with the orthopedic screw head of the medial malleolus.  I do believe the patient is experiencing orthopedic hardware pain because of the screw head.  Paresthesias numbness noted to digits 3, 4, 5 of the right foot.  Muscle strength 5/5 all compartments.  Radiographic Exam 02/19/2023 RT ankle:  Orthopedic hardware stable.  Good routine healing of the fracture fragments.  Event degenerative changes noted to the tibiotalar joint consistent with advanced posttraumatic DJD.   The orthopedic screw head to the medial malleolus does appear to be somewhat prominent and correlates directly with the patient's symptoms clinically  Assessment: 1. s/p ORIF RT ankle. DOS: 02/23/2022 2.  Symptomatic screw head medial malleolus right ankle   Plan of Care:  1. Patient was evaluated.  2.  Today we discussed the possibility of removing the orthopedic screw to the medial malleolus of the right ankle.  The patient would like to pursue this option.  She says that she has pain and tenderness on a daily basis and is unable to wear any shoes around the area because of the pain. 3.  Today we discussed removing the orthopedic screw.  I do believe the benefits far outweigh the minimal risk involved with simple screw removal.  Risk benefits advantages and disadvantages were explained.  No guarantees were expressed or implied.  All patient questions answered. 4.  Authorization for surgery was initiated today.  Surgery will consist of removal of orthopedic screw medial malleolus right. Stryker 4.52m cannulated  5.  Medical clearance prior to surgery 6.  Return to clinic 1 week postop  BEdrick Kins DPM [facility]  Dr. BEdrick Kins DPM    [address]  [place], [place] [address]                Office [phone]  Fax [phone]   

## 2023-02-20 ENCOUNTER — Encounter: Payer: Self-pay | Admitting: Podiatry

## 2023-02-26 DIAGNOSIS — Z01818 Encounter for other preprocedural examination: Secondary | ICD-10-CM | POA: Diagnosis not present

## 2023-02-26 DIAGNOSIS — Z79899 Other long term (current) drug therapy: Secondary | ICD-10-CM | POA: Diagnosis not present

## 2023-02-26 DIAGNOSIS — I10 Essential (primary) hypertension: Secondary | ICD-10-CM | POA: Diagnosis not present

## 2023-02-27 ENCOUNTER — Other Ambulatory Visit: Payer: Self-pay | Admitting: Nurse Practitioner

## 2023-02-27 DIAGNOSIS — I10 Essential (primary) hypertension: Secondary | ICD-10-CM

## 2023-03-04 ENCOUNTER — Telehealth: Payer: Self-pay | Admitting: Urology

## 2023-03-04 ENCOUNTER — Ambulatory Visit
Admission: RE | Admit: 2023-03-04 | Discharge: 2023-03-04 | Disposition: A | Discharge status: Home / Self Care | Payer: Medicare HMO | Source: Ambulatory Visit | Attending: Nurse Practitioner | Admitting: Nurse Practitioner

## 2023-03-04 DIAGNOSIS — I10 Essential (primary) hypertension: Secondary | ICD-10-CM | POA: Diagnosis not present

## 2023-03-04 DIAGNOSIS — N261 Atrophy of kidney (terminal): Secondary | ICD-10-CM | POA: Diagnosis not present

## 2023-03-04 NOTE — Telephone Encounter (Signed)
DOS - 04/04/23  REMOVAL HARDWARE RIGHT ANKLE --- 20680  [employer]   PER [place] [employer] FOR CPT CODE 60454 NO PRIOR AUTH IS REQUIRED.

## 2023-04-10 ENCOUNTER — Encounter: Payer: Medicare HMO | Admitting: Podiatry

## 2023-04-11 ENCOUNTER — Other Ambulatory Visit: Payer: Self-pay | Admitting: Podiatry

## 2023-04-11 ENCOUNTER — Telehealth: Payer: Self-pay | Admitting: *Deleted

## 2023-04-11 ENCOUNTER — Telehealth: Payer: Self-pay | Admitting: Podiatry

## 2023-04-11 DIAGNOSIS — Z4889 Encounter for other specified surgical aftercare: Secondary | ICD-10-CM | POA: Diagnosis not present

## 2023-04-11 DIAGNOSIS — T8484XA Pain due to internal orthopedic prosthetic devices, implants and grafts, initial encounter: Secondary | ICD-10-CM | POA: Diagnosis not present

## 2023-04-11 MED ORDER — ACETAMINOPHEN-CODEINE 300-30 MG PO TABS: ORAL_TABLET | ORAL | 0 refills | Status: DC

## 2023-04-11 NOTE — Telephone Encounter (Signed)
Pharmacy call - wants to alter the prescription for the Tylenol 3 - wants to change to max of 10 tablets per day.  Please advise

## 2023-04-11 NOTE — Telephone Encounter (Addendum)
Patient's husband is calling for clarification on medication sent to pharmacy, reached out to pharmacy and they want the instructions changed to 11 tablets , with a maximum within a 24 hour period since patient has not been on opioid recently, has not been on it in a year and is opioid naive, has not build up tolerance like she did when taking it before.  Spoke with the pharmacist([place]) giving verbal approval per physician to change, verbalized understanding and will update.

## 2023-04-11 NOTE — Progress Notes (Signed)
PRN postop 

## 2023-04-12 ENCOUNTER — Encounter: Payer: Medicare HMO | Admitting: Podiatry

## 2023-04-16 ENCOUNTER — Ambulatory Visit (INDEPENDENT_AMBULATORY_CARE_PROVIDER_SITE_OTHER): Payer: Medicare HMO | Admitting: Podiatry

## 2023-04-16 ENCOUNTER — Ambulatory Visit (INDEPENDENT_AMBULATORY_CARE_PROVIDER_SITE_OTHER): Payer: Medicare HMO

## 2023-04-16 DIAGNOSIS — Z9889 Other specified postprocedural states: Secondary | ICD-10-CM | POA: Diagnosis not present

## 2023-04-16 DIAGNOSIS — M19071 Primary osteoarthritis, right ankle and foot: Secondary | ICD-10-CM | POA: Diagnosis not present

## 2023-04-16 NOTE — Progress Notes (Signed)
Chief Complaint  Patient presents with   Routine Post Op    POV # 1 DOS 04/11/23 --- REMOVAL OF ORTHOPEDIC SCREW RIGHT ANKLE, patient denies any pain, NO N/V/F/C/SOB, X-Rays done today  TX: surgical shoe     Subjective:  Patient presents today status post removal of orthopedic screws to the medial malleolus of the right ankle.  DOS: 04/11/2023.  She is doing well.  Minimal pain.  WBAT in a surgical shoe  Past Medical History:  Diagnosis Date   Cancer (HCC)    skin ca   Cataract cortical, senile    Colon polyp    Diabetes mellitus type 2, uncomplicated (HCC)    Essential hypertension    GERD (gastroesophageal reflux disease)    Heart murmur    followed by PCP   Hyperlipidemia    Hypothyroidism    Osteopenia    Prediabetes    Wears dentures    full upper and lower    Past Surgical History:  Procedure Laterality Date   ABDOMINAL HYSTERECTOMY     BREAST CYST ASPIRATION Right    neg   CATARACT EXTRACTION Right 2012   CATARACT EXTRACTION W/PHACO Left 01/08/2018   Procedure: CATARACT EXTRACTION PHACO AND INTRAOCULAR LENS PLACEMENT (IOC)  COMPLICATED LEFT TORIC LENS;  Surgeon: Lockie Mola, MD;  Location: [facility];  Service: Ophthalmology;  Laterality: Left;   COLONOSCOPY  10/30/2010   COLONOSCOPY  10/14/2017   CRANIOPLASTY     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   GANGLION CYST EXCISION Right 10/10/2016   Procedure: EXCISION OF A RIGHT DORSAL CARPAL GANGLION;  Surgeon: Christena Flake, MD;  Location: [facility];  Service: Orthopedics;  Laterality: Right;   ORIF ANKLE FRACTURE Right 02/23/2022   Procedure: OPEN REDUCTION INTERNAL FIXATION (ORIF) ANKLE FRACTURE;  Surgeon: Felecia Shelling, DPM;  Location: [facility];  Service: Podiatry;  Laterality: Right;   SHOULDER ARTHROSCOPY Right 02/22/2016   Procedure: Extensive arthroscopic debridement, arthroscopic subscapularis tendon tear, arthroscopic subacromial decompression, mini-open rotator  cuff repair, and mini-open biceps tenodesis, right shoulder. ;  Surgeon: Christena Flake, MD;  Location: [facility];  Service: Orthopedics;  Laterality: Right;    Allergies  Allergen Reactions   Hydromorphone Hcl     "Spaced out", Hallucinations, dropping things for 48 hours   Lisinopril Cough   Percocet [Oxycodone-Acetaminophen] Nausea And Vomiting   Vicodin [Hydrocodone-Acetaminophen] Nausea And Vomiting    Objective/Physical Exam Neurovascular status intact.  Incision well coapted with sutures intact. No sign of infectious process noted. No dehiscence. No active bleeding noted.  Minimal  Radiographic Exam RT ankle 04/16/2023:  Absence of the medial malleoli or ankle screw noted compared to prior x-rays.  Everything else unchanged  Assessment: 1. s/p removal of orthopedic screw medial aspect of the ankle right. DOS: 04/11/2023 -Patient evaluated.  X-rays reviewed -Patient may begin washing and showering and getting the foot wet.  Continue WBAT in any shoe that does not irritate the ankle -Return to clinic  Felecia Shelling, DPM [facility]  Dr. Felecia Shelling, DPM    [address] [place], [place] [address]                Office [phone]  Fax [phone]

## 2023-04-17 ENCOUNTER — Encounter: Payer: Medicare HMO | Admitting: Podiatry

## 2023-04-19 ENCOUNTER — Encounter: Payer: Medicare HMO | Admitting: Podiatry

## 2023-04-22 ENCOUNTER — Telehealth: Payer: Self-pay | Admitting: *Deleted

## 2023-04-22 NOTE — Telephone Encounter (Signed)
Patient is calling because she has noticed clear drainage and redness coming from the surgical site,denies pain, please call back on cell if needed, has to go out later in the day.

## 2023-04-23 ENCOUNTER — Other Ambulatory Visit: Payer: Self-pay | Admitting: Podiatry

## 2023-04-23 DIAGNOSIS — M19071 Primary osteoarthritis, right ankle and foot: Secondary | ICD-10-CM

## 2023-04-23 DIAGNOSIS — Z961 Presence of intraocular lens: Secondary | ICD-10-CM | POA: Diagnosis not present

## 2023-04-23 DIAGNOSIS — Z9889 Other specified postprocedural states: Secondary | ICD-10-CM

## 2023-04-23 DIAGNOSIS — Z0100 Encounter for examination of eyes and vision without abnormal findings: Secondary | ICD-10-CM | POA: Diagnosis not present

## 2023-04-23 DIAGNOSIS — E119 Type 2 diabetes mellitus without complications: Secondary | ICD-10-CM | POA: Diagnosis not present

## 2023-04-24 ENCOUNTER — Telehealth: Payer: Self-pay | Admitting: Podiatry

## 2023-04-24 NOTE — Telephone Encounter (Signed)
Pt called because she got a message on mychart about an order and was not sure what it was for. Upon checking chart I did not see anything ordered.  She did state she called earlier this week because her incision cite was weeping but it is no longer weeping and she will see you at the appt 5.7 in [place].  I did tell pt if needed to call and we could get her seen in [place] this week

## 2023-04-26 ENCOUNTER — Encounter: Payer: Medicare HMO | Admitting: Podiatry

## 2023-04-30 ENCOUNTER — Ambulatory Visit (INDEPENDENT_AMBULATORY_CARE_PROVIDER_SITE_OTHER): Payer: Medicare HMO | Admitting: Podiatry

## 2023-04-30 DIAGNOSIS — M19071 Primary osteoarthritis, right ankle and foot: Secondary | ICD-10-CM

## 2023-04-30 MED ORDER — BETAMETHASONE SOD PHOS & ACET 6 (3-3) MG/ML IJ SUSP: 3.0000 mg | Freq: Once | INTRAMUSCULAR | Status: DC

## 2023-04-30 NOTE — Progress Notes (Signed)
Chief Complaint  Patient presents with   Routine Post Op    POV # 2 DOS 04/11/23 --- REMOVAL OF ORTHOPEDIC SCREW RIGHT ANKLE, patient denies any pain, NO N/V/F/C/SOB, sutures were removed today          Subjective:  Patient presents today status post removal of orthopedic screws to the medial malleolus of the right ankle.  DOS: 04/11/2023.  She is doing well.  Minimal pain.  WBAT in a surgical shoe Patient states that she does have some stiffness especially in the mornings with tenderness.  There is also some generalized stiffness in the ankle joint which gradually began after ORIF surgery.  Past Medical History:  Diagnosis Date   Cancer (HCC)    skin ca   Cataract cortical, senile    Colon polyp    Diabetes mellitus type 2, uncomplicated (HCC)    Essential hypertension    GERD (gastroesophageal reflux disease)    Heart murmur    followed by PCP   Hyperlipidemia    Hypothyroidism    Osteopenia    Prediabetes    Wears dentures    full upper and lower    Past Surgical History:  Procedure Laterality Date   ABDOMINAL HYSTERECTOMY     BREAST CYST ASPIRATION Right    neg   CATARACT EXTRACTION Right 2012   CATARACT EXTRACTION W/PHACO Left 01/08/2018   Procedure: CATARACT EXTRACTION PHACO AND INTRAOCULAR LENS PLACEMENT (IOC)  COMPLICATED LEFT TORIC LENS;  Surgeon: Lockie Mola, MD;  Location: [facility];  Service: Ophthalmology;  Laterality: Left;   COLONOSCOPY  10/30/2010   COLONOSCOPY  10/14/2017   CRANIOPLASTY     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   GANGLION CYST EXCISION Right 10/10/2016   Procedure: EXCISION OF A RIGHT DORSAL CARPAL GANGLION;  Surgeon: Christena Flake, MD;  Location: [facility];  Service: Orthopedics;  Laterality: Right;   ORIF ANKLE FRACTURE Right 02/23/2022   Procedure: OPEN REDUCTION INTERNAL FIXATION (ORIF) ANKLE FRACTURE;  Surgeon: Felecia Shelling, DPM;  Location: [facility];  Service: Podiatry;  Laterality:  Right;   SHOULDER ARTHROSCOPY Right 02/22/2016   Procedure: Extensive arthroscopic debridement, arthroscopic subscapularis tendon tear, arthroscopic subacromial decompression, mini-open rotator cuff repair, and mini-open biceps tenodesis, right shoulder. ;  Surgeon: Christena Flake, MD;  Location: [facility];  Service: Orthopedics;  Laterality: Right;    Allergies  Allergen Reactions   Hydromorphone Hcl     "Spaced out", Hallucinations, dropping things for 48 hours   Lisinopril Cough   Percocet [Oxycodone-Acetaminophen] Nausea And Vomiting   Vicodin [Hydrocodone-Acetaminophen] Nausea And Vomiting    Objective/Physical Exam Neurovascular status intact.  Incision well coapted with sutures intact. No sign of infectious process noted. No dehiscence. No active bleeding noted.   Patient does have some limited range of motion with stiffness and arthritic pain to the right ankle which is expected after her ankle surgery  Radiographic Exam RT ankle 04/16/2023:  Absence of the medial malleoli or ankle screw noted compared to prior x-rays.  Everything else unchanged  Assessment: 1. s/p removal of orthopedic screw medial aspect of the ankle right. DOS: 04/11/2023 2.  Posttraumatic arthritis/DJD right ankle -Patient evaluated.  Sutures removed -Patient may resume full activity no restrictions -Injection of 0.5 cc Celestone Soluspan injected into the lateral aspect of the right ankle -Return to clinic as needed  Felecia Shelling, DPM [facility]  Dr. Felecia Shelling, DPM  [address], [place] [address]                Office [phone]  Fax [phone]

## 2023-05-01 ENCOUNTER — Encounter: Payer: Medicare HMO | Admitting: Podiatry

## 2023-05-03 ENCOUNTER — Encounter: Payer: Medicare HMO | Admitting: Podiatry

## 2023-05-10 ENCOUNTER — Encounter: Payer: Medicare HMO | Admitting: Podiatry

## 2023-08-08 DIAGNOSIS — Z1331 Encounter for screening for depression: Secondary | ICD-10-CM | POA: Diagnosis not present

## 2023-08-08 DIAGNOSIS — Z79899 Other long term (current) drug therapy: Secondary | ICD-10-CM | POA: Diagnosis not present

## 2023-08-08 DIAGNOSIS — I10 Essential (primary) hypertension: Secondary | ICD-10-CM | POA: Diagnosis not present

## 2023-08-08 DIAGNOSIS — R5383 Other fatigue: Secondary | ICD-10-CM | POA: Diagnosis not present

## 2023-08-08 DIAGNOSIS — E7800 Pure hypercholesterolemia, unspecified: Secondary | ICD-10-CM | POA: Diagnosis not present

## 2023-08-08 DIAGNOSIS — E039 Hypothyroidism, unspecified: Secondary | ICD-10-CM | POA: Diagnosis not present

## 2023-08-08 DIAGNOSIS — E119 Type 2 diabetes mellitus without complications: Secondary | ICD-10-CM | POA: Diagnosis not present

## 2023-08-08 DIAGNOSIS — Z0000 Encounter for general adult medical examination without abnormal findings: Secondary | ICD-10-CM | POA: Diagnosis not present

## 2023-08-08 DIAGNOSIS — K219 Gastro-esophageal reflux disease without esophagitis: Secondary | ICD-10-CM | POA: Diagnosis not present

## 2023-08-09 ENCOUNTER — Ambulatory Visit: Payer: Medicare HMO | Admitting: Podiatry

## 2023-08-09 ENCOUNTER — Ambulatory Visit (INDEPENDENT_AMBULATORY_CARE_PROVIDER_SITE_OTHER): Payer: Medicare HMO

## 2023-08-09 DIAGNOSIS — Z9889 Other specified postprocedural states: Secondary | ICD-10-CM

## 2023-08-09 DIAGNOSIS — M19071 Primary osteoarthritis, right ankle and foot: Secondary | ICD-10-CM

## 2023-08-09 DIAGNOSIS — M25571 Pain in right ankle and joints of right foot: Secondary | ICD-10-CM

## 2023-08-09 MED ORDER — BETAMETHASONE SOD PHOS & ACET 6 (3-3) MG/ML IJ SUSP
3.0000 mg | Freq: Once | INTRAMUSCULAR | Status: AC
Start: 2023-08-09 — End: 2023-08-09
  Administered 2023-08-09: 3 mg via INTRA_ARTICULAR

## 2023-08-09 MED ORDER — MELOXICAM 15 MG PO TABS: 15.0000 mg | ORAL_TABLET | Freq: Every day | ORAL | 1 refills | Status: AC

## 2023-08-09 NOTE — Progress Notes (Signed)
Chief Complaint  Patient presents with   Foot Pain    right surgical ankle still swelling daily    Subjective:  Patient presents today for follow-up evaluation of right ankle pain and arthritis.  History of ORIF right ankle.  Patient has had a cortisone injection in the past which has helped for several months.  He says that now she experiences pain and tenderness on a daily basis with swelling.  Presenting for further treatment and evaluation  Past Medical History:  Diagnosis Date   Cancer (HCC)    skin ca   Cataract cortical, senile    Colon polyp    Diabetes mellitus type 2, uncomplicated (HCC)    Essential hypertension    GERD (gastroesophageal reflux disease)    Heart murmur    followed by PCP   Hyperlipidemia    Hypothyroidism    Osteopenia    Prediabetes    Wears dentures    full upper and lower    Past Surgical History:  Procedure Laterality Date   ABDOMINAL HYSTERECTOMY     BREAST CYST ASPIRATION Right    neg   CATARACT EXTRACTION Right 2012   CATARACT EXTRACTION W/PHACO Left 01/08/2018   Procedure: CATARACT EXTRACTION PHACO AND INTRAOCULAR LENS PLACEMENT (IOC)  COMPLICATED LEFT TORIC LENS;  Surgeon: Lockie Mola, MD;  Location: [facility];  Service: Ophthalmology;  Laterality: Left;   COLONOSCOPY  10/30/2010   COLONOSCOPY  10/14/2017   CRANIOPLASTY     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   GANGLION CYST EXCISION Right 10/10/2016   Procedure: EXCISION OF A RIGHT DORSAL CARPAL GANGLION;  Surgeon: Christena Flake, MD;  Location: [facility];  Service: Orthopedics;  Laterality: Right;   ORIF ANKLE FRACTURE Right 02/23/2022   Procedure: OPEN REDUCTION INTERNAL FIXATION (ORIF) ANKLE FRACTURE;  Surgeon: Felecia Shelling, DPM;  Location: [facility];  Service: Podiatry;  Laterality: Right;   SHOULDER ARTHROSCOPY Right 02/22/2016   Procedure: Extensive arthroscopic debridement, arthroscopic subscapularis tendon tear, arthroscopic  subacromial decompression, mini-open rotator cuff repair, and mini-open biceps tenodesis, right shoulder. ;  Surgeon: Christena Flake, MD;  Location: [facility];  Service: Orthopedics;  Laterality: Right;    Allergies  Allergen Reactions   Hydromorphone Hcl     "Spaced out", Hallucinations, dropping things for 48 hours   Lisinopril Cough   Percocet [Oxycodone-Acetaminophen] Nausea And Vomiting   Vicodin [Hydrocodone-Acetaminophen] Nausea And Vomiting    Objective/Physical Exam Neurovascular status intact.  All incisions healed.  Moderate edema noted.  No erythema.  Patient does have some limited range of motion with stiffness and arthritic pain to the right ankle.  Radiographic Exam RT ankle 04/16/2023:  Absence of the medial malleoli or ankle screw noted compared to prior x-rays.  Everything else unchanged  Radiographic exam RT ankle 08/09/2023: Advanced severe degenerative changes noted to the tibiotalar joint.  The tibiotalar joint is congruent.  No acute fractures identified.  Assessment: 1. PSxHx ORIF RT ankle.  02/23/2022 2.  Posttraumatic arthritis/DJD right ankle -Patient evaluated.   -Injection of 0.5 cc Celestone Soluspan injected into the right ankle -Prescription for meloxicam 15 mg daily -Recommend good supportive tennis shoes and sneakers -Return to clinic as needed  Felecia Shelling, DPM [facility]  Dr. Felecia Shelling, DPM    [address]  [place], [place] [address]                Office [phone]  Fax [phone]

## 2023-10-11 DIAGNOSIS — D2261 Melanocytic nevi of right upper limb, including shoulder: Secondary | ICD-10-CM | POA: Diagnosis not present

## 2023-10-11 DIAGNOSIS — L821 Other seborrheic keratosis: Secondary | ICD-10-CM | POA: Diagnosis not present

## 2023-10-11 DIAGNOSIS — D225 Melanocytic nevi of trunk: Secondary | ICD-10-CM | POA: Diagnosis not present

## 2023-10-11 DIAGNOSIS — D2272 Melanocytic nevi of left lower limb, including hip: Secondary | ICD-10-CM | POA: Diagnosis not present

## 2023-10-11 DIAGNOSIS — D2271 Melanocytic nevi of right lower limb, including hip: Secondary | ICD-10-CM | POA: Diagnosis not present

## 2023-10-11 DIAGNOSIS — D2262 Melanocytic nevi of left upper limb, including shoulder: Secondary | ICD-10-CM | POA: Diagnosis not present

## 2023-11-15 ENCOUNTER — Other Ambulatory Visit: Payer: Self-pay | Admitting: Nurse Practitioner

## 2023-11-15 DIAGNOSIS — Z1231 Encounter for screening mammogram for malignant neoplasm of breast: Secondary | ICD-10-CM

## 2023-12-12 ENCOUNTER — Ambulatory Visit
Admission: RE | Admit: 2023-12-12 | Discharge: 2023-12-12 | Disposition: A | Discharge status: Home / Self Care | Payer: Medicare HMO | Source: Ambulatory Visit | Attending: Nurse Practitioner | Admitting: Nurse Practitioner

## 2023-12-12 DIAGNOSIS — Z1231 Encounter for screening mammogram for malignant neoplasm of breast: Secondary | ICD-10-CM | POA: Insufficient documentation

## 2024-02-08 ENCOUNTER — Other Ambulatory Visit: Payer: Self-pay | Admitting: Podiatry

## 2024-02-26 DIAGNOSIS — N9489 Other specified conditions associated with female genital organs and menstrual cycle: Secondary | ICD-10-CM | POA: Diagnosis not present

## 2024-02-26 DIAGNOSIS — E7800 Pure hypercholesterolemia, unspecified: Secondary | ICD-10-CM | POA: Diagnosis not present

## 2024-02-26 DIAGNOSIS — E119 Type 2 diabetes mellitus without complications: Secondary | ICD-10-CM | POA: Diagnosis not present

## 2024-02-26 DIAGNOSIS — F4321 Adjustment disorder with depressed mood: Secondary | ICD-10-CM | POA: Diagnosis not present

## 2024-02-26 DIAGNOSIS — Z79899 Other long term (current) drug therapy: Secondary | ICD-10-CM | POA: Diagnosis not present

## 2024-02-26 DIAGNOSIS — E039 Hypothyroidism, unspecified: Secondary | ICD-10-CM | POA: Diagnosis not present

## 2024-02-26 DIAGNOSIS — I10 Essential (primary) hypertension: Secondary | ICD-10-CM | POA: Diagnosis not present

## 2024-02-26 DIAGNOSIS — M545 Low back pain, unspecified: Secondary | ICD-10-CM | POA: Diagnosis not present

## 2024-02-26 DIAGNOSIS — M810 Age-related osteoporosis without current pathological fracture: Secondary | ICD-10-CM | POA: Diagnosis not present

## 2024-02-26 DIAGNOSIS — G8929 Other chronic pain: Secondary | ICD-10-CM | POA: Diagnosis not present

## 2024-04-28 DIAGNOSIS — H43813 Vitreous degeneration, bilateral: Secondary | ICD-10-CM | POA: Diagnosis not present

## 2024-04-28 DIAGNOSIS — E119 Type 2 diabetes mellitus without complications: Secondary | ICD-10-CM | POA: Diagnosis not present

## 2024-04-28 DIAGNOSIS — Z0100 Encounter for examination of eyes and vision without abnormal findings: Secondary | ICD-10-CM | POA: Diagnosis not present

## 2024-04-28 DIAGNOSIS — Z961 Presence of intraocular lens: Secondary | ICD-10-CM | POA: Diagnosis not present

## 2024-08-28 ENCOUNTER — Other Ambulatory Visit: Payer: Self-pay | Admitting: Nurse Practitioner

## 2024-08-28 DIAGNOSIS — Z79899 Other long term (current) drug therapy: Secondary | ICD-10-CM | POA: Diagnosis not present

## 2024-08-28 DIAGNOSIS — K219 Gastro-esophageal reflux disease without esophagitis: Secondary | ICD-10-CM | POA: Diagnosis not present

## 2024-08-28 DIAGNOSIS — Z1331 Encounter for screening for depression: Secondary | ICD-10-CM | POA: Diagnosis not present

## 2024-08-28 DIAGNOSIS — E7800 Pure hypercholesterolemia, unspecified: Secondary | ICD-10-CM | POA: Diagnosis not present

## 2024-08-28 DIAGNOSIS — E039 Hypothyroidism, unspecified: Secondary | ICD-10-CM | POA: Diagnosis not present

## 2024-08-28 DIAGNOSIS — I10 Essential (primary) hypertension: Secondary | ICD-10-CM | POA: Diagnosis not present

## 2024-08-28 DIAGNOSIS — Z1231 Encounter for screening mammogram for malignant neoplasm of breast: Secondary | ICD-10-CM

## 2024-08-28 DIAGNOSIS — M8580 Other specified disorders of bone density and structure, unspecified site: Secondary | ICD-10-CM | POA: Diagnosis not present

## 2024-08-28 DIAGNOSIS — Z0000 Encounter for general adult medical examination without abnormal findings: Secondary | ICD-10-CM | POA: Diagnosis not present

## 2024-08-28 DIAGNOSIS — E119 Type 2 diabetes mellitus without complications: Secondary | ICD-10-CM | POA: Diagnosis not present

## 2024-08-28 DIAGNOSIS — F4321 Adjustment disorder with depressed mood: Secondary | ICD-10-CM | POA: Diagnosis not present

## 2024-09-29 DIAGNOSIS — Z23 Encounter for immunization: Secondary | ICD-10-CM | POA: Diagnosis not present

## 2024-10-13 DIAGNOSIS — D225 Melanocytic nevi of trunk: Secondary | ICD-10-CM | POA: Diagnosis not present

## 2024-10-13 DIAGNOSIS — L570 Actinic keratosis: Secondary | ICD-10-CM | POA: Diagnosis not present

## 2024-10-13 DIAGNOSIS — D2262 Melanocytic nevi of left upper limb, including shoulder: Secondary | ICD-10-CM | POA: Diagnosis not present

## 2024-10-13 DIAGNOSIS — D2271 Melanocytic nevi of right lower limb, including hip: Secondary | ICD-10-CM | POA: Diagnosis not present

## 2024-10-13 DIAGNOSIS — L821 Other seborrheic keratosis: Secondary | ICD-10-CM | POA: Diagnosis not present

## 2024-10-13 DIAGNOSIS — D2272 Melanocytic nevi of left lower limb, including hip: Secondary | ICD-10-CM | POA: Diagnosis not present

## 2024-10-13 DIAGNOSIS — D2261 Melanocytic nevi of right upper limb, including shoulder: Secondary | ICD-10-CM | POA: Diagnosis not present

## 2024-10-16 DIAGNOSIS — Z1211 Encounter for screening for malignant neoplasm of colon: Secondary | ICD-10-CM | POA: Diagnosis not present

## 2024-10-22 LAB — COLOGUARD: COLOGUARD: POSITIVE — AB

## 2024-10-27 DIAGNOSIS — L820 Inflamed seborrheic keratosis: Secondary | ICD-10-CM | POA: Diagnosis not present

## 2024-10-27 DIAGNOSIS — L570 Actinic keratosis: Secondary | ICD-10-CM | POA: Diagnosis not present

## 2024-10-27 DIAGNOSIS — L538 Other specified erythematous conditions: Secondary | ICD-10-CM | POA: Diagnosis not present

## 2024-10-27 DIAGNOSIS — R208 Other disturbances of skin sensation: Secondary | ICD-10-CM | POA: Diagnosis not present

## 2024-11-09 DIAGNOSIS — R195 Other fecal abnormalities: Secondary | ICD-10-CM | POA: Diagnosis not present

## 2024-11-18 ENCOUNTER — Other Ambulatory Visit: Payer: Self-pay | Admitting: Podiatry

## 2024-11-23 ENCOUNTER — Encounter: Payer: Self-pay | Admitting: Gastroenterology

## 2024-11-23 NOTE — Anesthesia Preprocedure Evaluation (Addendum)
 Anesthesia Evaluation  Patient identified by MRN, date of birth, ID band Patient awake    Reviewed: Allergy & Precautions, H&P , NPO status , Patient's Chart, lab work & pertinent test results  Airway Mallampati: II  TM Distance: >3 FB Neck ROM: Full    Dental no notable dental hx. (+) Lower Dentures, Upper Dentures   Pulmonary neg pulmonary ROS, former smoker   Pulmonary exam normal breath sounds clear to auscultation       Cardiovascular hypertension, negative cardio ROS Normal cardiovascular exam+ Valvular Problems/Murmurs  Rhythm:Regular Rate:Normal     Neuro/Psych  PSYCHIATRIC DISORDERS  Depression    negative neurological ROS  negative psych ROS   GI/Hepatic negative GI ROS, Neg liver ROS,GERD  ,,  Endo/Other  negative endocrine ROSdiabetesHypothyroidism    Renal/GU negative Renal ROS  negative genitourinary   Musculoskeletal negative musculoskeletal ROS (+) Arthritis ,    Abdominal   Peds negative pediatric ROS (+)  Hematology negative hematology ROS (+)   Anesthesia Other Findings Heart murmur  Wears dentures Prediabetes  Hypothyroid Osteopenia Cancer (HCC) Cataract cortical, senile Colon polyp  Diabetes mellitus type 2, uncomplicated (HCC) Essential hypertension  GERD (gastroesophageal reflux disease) Arthritis     Reproductive/Obstetrics negative OB ROS                              Anesthesia Physical Anesthesia Plan  ASA: 3  Anesthesia Plan: General   Post-op Pain Management:    Induction: Intravenous  PONV Risk Score and Plan:   Airway Management Planned: Natural Airway and Nasal Cannula  Additional Equipment:   Intra-op Plan:   Post-operative Plan:   Informed Consent: I have reviewed the patients History and Physical, chart, labs and discussed the procedure including the risks, benefits and alternatives for the proposed anesthesia with the patient or  authorized representative who has indicated his/her understanding and acceptance.     Dental Advisory Given  Plan Discussed with: Anesthesiologist, CRNA and Surgeon  Anesthesia Plan Comments: (Patient consented for risks of anesthesia including but not limited to:  - adverse reactions to medications - risk of airway placement if required - damage to eyes, teeth, lips or other oral mucosa - nerve damage due to positioning  - sore throat or hoarseness - Damage to heart, brain, nerves, lungs, other parts of body or loss of life  Patient voiced understanding and assent.)         Anesthesia Quick Evaluation

## 2024-11-27 ENCOUNTER — Ambulatory Visit: Payer: Self-pay | Admitting: Anesthesiology

## 2024-11-27 ENCOUNTER — Other Ambulatory Visit: Payer: Self-pay

## 2024-11-27 ENCOUNTER — Encounter
Admission: RE | Disposition: A | Discharge status: Home / Self Care | Payer: Self-pay | Source: Home / Self Care | Attending: Gastroenterology

## 2024-11-27 ENCOUNTER — Ambulatory Visit
Admission: RE | Admit: 2024-11-27 | Discharge: 2024-11-27 | Disposition: A | Discharge status: Home / Self Care | Attending: Gastroenterology | Admitting: Gastroenterology

## 2024-11-27 ENCOUNTER — Encounter: Payer: Self-pay | Admitting: Gastroenterology

## 2024-11-27 HISTORY — PX: POLYPECTOMY: SHX149

## 2024-11-27 HISTORY — DX: Unspecified osteoarthritis, unspecified site: M19.90

## 2024-11-27 HISTORY — PX: COLONOSCOPY: SHX5424

## 2024-11-27 SURGERY — COLONOSCOPY
Anesthesia: General

## 2024-11-27 MED ORDER — PROPOFOL 10 MG/ML IV BOLUS
INTRAVENOUS | Status: DC | PRN
  Administered 2024-11-27: 50 mg via INTRAVENOUS
  Administered 2024-11-27: 100 ug/kg/min via INTRAVENOUS

## 2024-11-27 MED ORDER — PROPOFOL 1000 MG/100ML IV EMUL
INTRAVENOUS | Status: AC
  Filled 2024-11-27: qty 100

## 2024-11-27 MED ORDER — LACTATED RINGERS IV SOLN: INTRAVENOUS | Status: DC

## 2024-11-27 MED ORDER — STERILE WATER FOR IRRIGATION IR SOLN
Status: DC | PRN
  Administered 2024-11-27: 1

## 2024-11-27 SURGICAL SUPPLY — 6 items
GOWN CVR UNV OPN BCK APRN NK (MISCELLANEOUS) ×2 IMPLANT
KIT PROCEDURE OLYMPUS (MISCELLANEOUS) ×1 IMPLANT
MANIFOLD NEPTUNE II (INSTRUMENTS) ×1 IMPLANT
SNARE COLD EXACTO (MISCELLANEOUS) IMPLANT
TRAP ETRAP POLY (MISCELLANEOUS) IMPLANT
WATER STERILE IRR 250ML POUR (IV SOLUTION) ×1 IMPLANT

## 2024-11-27 NOTE — Anesthesia Postprocedure Evaluation (Signed)
 Anesthesia Post Note  Patient: Gloria Eaton  Procedure(s) Performed: COLONOSCOPY POLYPECTOMY, INTESTINE  Patient location during evaluation: PACU Anesthesia Type: General Level of consciousness: awake and alert Pain management: pain level controlled Vital Signs Assessment: post-procedure vital signs reviewed and stable Respiratory status: spontaneous breathing, nonlabored ventilation, respiratory function stable and patient connected to nasal cannula oxygen Cardiovascular status: blood pressure returned to baseline and stable Postop Assessment: no apparent nausea or vomiting Anesthetic complications: no   No notable events documented.   Last Vitals:  Vitals:   11/27/24 0816 11/27/24 0822  BP: 106/61 125/72  Pulse: 71   Resp: 15   Temp: 36.7 C 36.7 C  SpO2: 97%     Last Pain:  Vitals:   11/27/24 0822  TempSrc:   PainSc: 0-No pain                 Donny JAYSON Trenee Igoe

## 2024-11-27 NOTE — Op Note (Signed)
 [facility] Patient Name: Gloria Eaton Procedure Date: 11/27/2024 7:12 AM MRN: [id number] Account #: [id number] Date of Birth: 08/09/1941 Admit Type: Outpatient Age: 83 Room: [facility] OR ROOM 01 Gender: Female Note Status: Finalized Instrument Name: Peds Colonoscope [id number] Procedure:             Colonoscopy Indications:           Positive Cologuard test Providers:             Corinn Jess Brooklyn MD, MD Referring MD:          Cheryl CHARLENA Jericho (Referring MD) Medicines:             General Anesthesia Complications:         No immediate complications. Estimated blood loss: None. Procedure:             Pre-Anesthesia Assessment:                        - Prior to the procedure, a History and Physical was                         performed, and patient medications and allergies were                         reviewed. The patient is competent. The risks and                         benefits of the procedure and the sedation options and                         risks were discussed with the patient. All questions                         were answered and informed consent was obtained.                         Patient identification and proposed procedure were                         verified by the physician, the nurse, the                         anesthesiologist, the anesthetist and the technician                         in the pre-procedure area in the procedure room in the                         endoscopy suite. Mental Status Examination: alert and                         oriented. Airway Examination: normal oropharyngeal                         airway and neck mobility. Respiratory Examination:                         clear to auscultation. CV Examination: normal.  Prophylactic Antibiotics: The patient does not require                         prophylactic antibiotics. Prior Anticoagulants: The                         patient has taken  no anticoagulant or antiplatelet                         agents. ASA Grade Assessment: III - A patient with                         severe systemic disease. After reviewing the risks and                         benefits, the patient was deemed in satisfactory                         condition to undergo the procedure. The anesthesia                         plan was to use general anesthesia. Immediately prior                         to administration of medications, the patient was                         re-assessed for adequacy to receive sedatives. The                         heart rate, respiratory rate, oxygen saturations,                         blood pressure, adequacy of pulmonary ventilation, and                         response to care were monitored throughout the                         procedure. The physical status of the patient was                         re-assessed after the procedure.                        After obtaining informed consent, the colonoscope was                         passed under direct vision. Throughout the procedure,                         the patient's blood pressure, pulse, and oxygen                         saturations were monitored continuously. The                         Colonoscope was introduced through the anus and  advanced to the the cecum, identified by appendiceal                         orifice and ileocecal valve. The colonoscopy was                         performed without difficulty. The patient tolerated                         the procedure well. The quality of the bowel                         preparation was evaluated using the BBPS [facility] Bowel                         Preparation Scale) with scores of: Right Colon = 3,                         Transverse Colon = 3 and Left Colon = 3 (entire mucosa                         seen well with no residual staining, small fragments                         of stool  or opaque liquid). The total BBPS score                         equals 9. The ileocecal valve, appendiceal orifice,                         and rectum were photographed. Findings:      The perianal and digital rectal examinations were normal. Pertinent       negatives include normal sphincter tone and no palpable rectal lesions.      Two sessile polyps were found in the rectum and cecum. The polyps were 3       to 5 mm in size. These polyps were removed with a cold snare. Resection       and retrieval were complete. Estimated blood loss: none.      The retroflexed view of the distal rectum and anal verge was normal and       showed no anal or rectal abnormalities. Impression:            - Two 3 to 5 mm polyps in the rectum and in the cecum,                         removed with a cold snare. Resected and retrieved.                        - The distal rectum and anal verge are normal on                         retroflexion view. Recommendation:        - Discharge patient to home (with escort).                        - Resume previous diet today.                        -  Continue present medications.                        - Await pathology results. Procedure Code(s):     --- Professional ---                        [phone], Colonoscopy, flexible; with removal of                         tumor(s), polyp(s), or other lesion(s) by snare                         technique Diagnosis Code(s):     --- Professional ---                        D12.8, Benign neoplasm of rectum                        D12.0, Benign neoplasm of cecum                        R19.5, Other fecal abnormalities CPT copyright 2022 American Medical Association. All rights reserved. The codes documented in this report are preliminary and upon coder review may  be revised to meet current compliance requirements. Dr. Corinn Brooklyn Corinn Jess Brooklyn MD, MD 11/27/2024 8:14:26 AM This report has been signed electronically. Number of  Addenda: 0 Note Initiated On: 11/27/2024 7:12 AM Scope Withdrawal Time: 0 hours 13 minutes 0 seconds  Total Procedure Duration: 0 hours 23 minutes 13 seconds  Estimated Blood Loss:  Estimated blood loss: none.      [facility]

## 2024-11-27 NOTE — H&P (Signed)
 Corinn JONELLE Brooklyn, MD [facility], DHIP [address]  [place], [place] [address]  Main: [phone] Fax:  [phone] Pager: [phone]   Primary Care Physician:  Jeffie Cheryl BRAVO, MD Primary Gastroenterologist:  Dr. Corinn JONELLE Brooklyn  Pre-Procedure History & Physical: HPI:  Gloria Eaton is a 83 y.o. female is here for an colonoscopy.   Past Medical History:  Diagnosis Date   Arthritis    hands and ankle   Cancer (HCC)    skin ca   Cataract cortical, senile    Colon polyp    Diabetes mellitus type 2, uncomplicated (HCC)    Essential hypertension    GERD (gastroesophageal reflux disease)    Heart murmur    followed by PCP   Hyperlipidemia    Hypothyroidism    Osteopenia    Prediabetes    Wears dentures    full upper and lower    Past Surgical History:  Procedure Laterality Date   ABDOMINAL HYSTERECTOMY     BREAST CYST ASPIRATION Right    neg   CATARACT EXTRACTION Right 2012   CATARACT EXTRACTION W/PHACO Left 01/08/2018   Procedure: CATARACT EXTRACTION PHACO AND INTRAOCULAR LENS PLACEMENT (IOC)  COMPLICATED LEFT TORIC LENS;  Surgeon: Mittie Gaskin, MD;  Location: [facility];  Service: Ophthalmology;  Laterality: Left;   COLONOSCOPY  10/30/2010   COLONOSCOPY  10/14/2017   CRANIOPLASTY     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   GANGLION CYST EXCISION Right 10/10/2016   Procedure: EXCISION OF A RIGHT DORSAL CARPAL GANGLION;  Surgeon: Norleen JINNY Maltos, MD;  Location: [facility];  Service: Orthopedics;  Laterality: Right;   ORIF ANKLE FRACTURE Right 02/23/2022   Procedure: OPEN REDUCTION INTERNAL FIXATION (ORIF) ANKLE FRACTURE;  Surgeon: Janit Thresa HERO, DPM;  Location: [facility];  Service: Podiatry;  Laterality: Right;   SHOULDER ARTHROSCOPY Right 02/22/2016   Procedure: Extensive arthroscopic debridement, arthroscopic subscapularis tendon tear, arthroscopic subacromial decompression, mini-open rotator  cuff repair, and mini-open biceps tenodesis, right shoulder. ;  Surgeon: Norleen JINNY Maltos, MD;  Location: [facility];  Service: Orthopedics;  Laterality: Right;    Prior to Admission medications   Medication Sig Start Date End Date Taking? Authorizing Provider  ascorbic acid (VITAMIN C) 1000 MG tablet Take 1,000 mg by mouth daily.   Yes [provider]  CALCIUM CARB-CHOLECALCIFEROL PO    Yes [provider]  citalopram (CELEXA) 10 MG tablet Take 10 mg by mouth at bedtime. 11/14/21  Yes [provider]  famotidine (PEPCID) 40 MG tablet Take 40 mg by mouth at bedtime. 01/24/22  Yes [provider]  ibuprofen  (ADVIL ) 800 MG tablet Take 1 tablet (800 mg total) by mouth every 6 (six) hours as needed. 12/06/22  Yes Janit Thresa HERO, DPM  levothyroxine (SYNTHROID, LEVOTHROID) 88 MCG tablet Take 88 mcg by mouth daily before breakfast.   Yes [provider]  losartan (COZAAR) 25 MG tablet Take 100 mg by mouth at bedtime. 01/24/22  Yes [provider]  lovastatin (MEVACOR) 20 MG tablet Take 40 mg by mouth at bedtime.   Yes [provider]  Multiple Vitamin (MULTIVITAMIN) capsule Take 1 capsule by mouth daily.   Yes [provider]  acetaminophen -codeine  (TYLENOL  #3) 300-30 MG tablet TAKE 1 TO 2 TABLETS BY MOUTH EVERY 4 HOURS AS NEEDED FOR MODERATE PAIN Patient not taking: Reported on 11/23/2024 04/11/23   Janit Thresa HERO, DPM  ipratropium (ATROVENT) 0.03 % nasal  spray Place 2 sprays into both nostrils daily as needed for rhinitis. 02/08/22   [provider]  meloxicam  (MOBIC ) 15 MG tablet TAKE 1 TABLET EVERY DAY Patient taking differently: Take 15 mg by mouth as needed. 02/10/24   Janit Thresa HERO, DPM  nystatin cream (MYCOSTATIN) Apply 1 application topically as needed for dry skin.    [provider]  omega-3 fish oil (MAXEPA) 1000 MG CAPS capsule Take by mouth daily. Patient not taking: Reported on 11/23/2024    [provider]  ondansetron  (ZOFRAN -ODT) 4 MG disintegrating tablet Take 1 tablet (4 mg total) by mouth every 8 (eight) hours as needed for nausea or vomiting. Patient not taking: Reported on 11/23/2024 02/13/22   Malinda, Paul F, MD  promethazine  (PHENERGAN ) 25 MG tablet Take 1 tablet (25 mg total) by mouth every 8 (eight) hours as needed for nausea or vomiting. Patient not taking: Reported on 11/23/2024 02/16/22   Janit Thresa HERO, DPM  silver  sulfADIAZINE  (SILVADENE ) 1 % cream Apply 1 application. topically daily. Patient not taking: Reported on 11/23/2024 03/16/22   Janit Thresa HERO, DPM  triamcinolone  cream (KENALOG ) 0.1 % Apply 1 application topically daily as needed (irritation). Patient not taking: Reported on 11/23/2024    [provider]  vitamin E 400 UNIT capsule Take 400 Units by mouth daily. Patient not taking: Reported on 11/23/2024    [provider]    Allergies as of 11/09/2024 - Review Complete 04/30/2023  Allergen Reaction Noted   Hydromorphone  hcl  02/19/2022   Lisinopril Cough 03/29/2021   Percocet [oxycodone -acetaminophen ] Nausea And Vomiting 02/20/2022   Vicodin [hydrocodone-acetaminophen ] Nausea And Vomiting 02/20/2016    Family History  Problem Relation Age of Onset   Breast cancer Neg Hx     Social History   Socioeconomic History   Marital status: Married    Spouse name: Optometrist   Number of children: 2   Years of education: Not on file   Highest education level: Not on file  Occupational History   Not on file  Tobacco Use   Smoking status: Former    Current packs/day: 0.00    Types: Cigarettes    Quit date: 11/11/2000    Years since quitting: 24.0   Smokeless tobacco: Never  Vaping Use   Vaping status: Never Used  Substance and Sexual Activity   Alcohol use: No   Drug use: Never   Sexual activity: Not on file  Other Topics Concern   Not on file  Social History Narrative   Not on file   Social Drivers of Health   Financial Resource  Strain: Patient Unable To Answer (11/09/2024)   Received from [facility]   Overall Financial Resource Strain (CARDIA)    Difficulty of Paying Living Expenses: Patient unable to answer  Food Insecurity: No Food Insecurity (11/09/2024)   Received from [facility]   Hunger Vital Sign    Within the past 12 months, you worried that your food would run out before you got the money to buy more.: Never true    Within the past 12 months, the food you bought just didn't last and you didn't have money to get more.: Never true  Transportation Needs: No Transportation Needs (11/09/2024)   Received from [facility] - Transportation    In the past 12 months, has lack of transportation kept you from medical appointments or from getting medications?: No    Lack of Transportation (  Non-Medical): No  Physical Activity: Not on file  Stress: Not on file  Social Connections: Not on file  Intimate Partner Violence: Not on file    Review of Systems: See HPI, otherwise negative ROS  Physical Exam: BP (!) 153/82   Pulse 86   Temp (!) 97.5 F (36.4 C) (Temporal)   Ht 5' (1.524 m)   Wt 63.5 kg   SpO2 97%   BMI 27.34 kg/m  General:   Alert,  pleasant and cooperative in NAD Head:  Normocephalic and atraumatic. Neck:  Supple; no masses or thyromegaly. Lungs:  Clear throughout to auscultation.    Heart:  Regular rate and rhythm. Abdomen:  Soft, nontender and nondistended. Normal bowel sounds, without guarding, and without rebound.   Neurologic:  Alert and  oriented x4;  grossly normal neurologically.  Impression/Plan: Gloria Eaton is here for an colonoscopy to be performed for positive cologuard  Risks, benefits, limitations, and alternatives regarding  colonoscopy have been reviewed with the patient.  Questions have been answered.  All parties agreeable.   Corinn Brooklyn, MD  11/27/2024, 7:32 AM

## 2024-11-27 NOTE — Transfer of Care (Signed)
 Immediate Anesthesia Transfer of Care Note  Patient: Gloria Eaton  Procedure(s) Performed: COLONOSCOPY POLYPECTOMY, INTESTINE  Patient Location: PACU  Anesthesia Type: General  Level of Consciousness: awake, alert  and patient cooperative  Airway and Oxygen Therapy: Patient Spontanous Breathing   Post-op Assessment: Post-op Vital signs reviewed, Patient's Cardiovascular Status Stable, Respiratory Function Stable, Patent Airway and No signs of Nausea or vomiting  Post-op Vital Signs: Reviewed and stable  Complications: No notable events documented.

## 2024-12-01 LAB — SURGICAL PATHOLOGY

## 2024-12-02 ENCOUNTER — Ambulatory Visit: Payer: Self-pay | Admitting: Gastroenterology

## 2024-12-11 NOTE — Progress Notes (Signed)
 Gloria Eaton                                          MRN: [id number]   12/11/2024   The [facility] Quality Team Specialist reviewed this patient medical record for the purposes of chart review for care gap closure. The following were reviewed: chart review for care gap closure-controlling blood pressure.    [facility] Quality Team

## 2024-12-14 ENCOUNTER — Ambulatory Visit
Admission: RE | Admit: 2024-12-14 | Discharge: 2024-12-14 | Disposition: A | Discharge status: Home / Self Care | Source: Ambulatory Visit | Attending: Nurse Practitioner | Admitting: Nurse Practitioner

## 2024-12-14 DIAGNOSIS — Z1231 Encounter for screening mammogram for malignant neoplasm of breast: Secondary | ICD-10-CM | POA: Insufficient documentation

## 2024-12-25 ENCOUNTER — Ambulatory Visit

## 2024-12-25 DIAGNOSIS — Z719 Counseling, unspecified: Secondary | ICD-10-CM

## 2024-12-25 NOTE — Progress Notes (Signed)
 In nurse clinic today to check scalp for lice. Per pt her scalp has been itching last few days and she had recently been around grandchildren who were school age. No lice or nits found. Pt did have on pinpoint scabbed over area on back of head and 2 pin point red areas on crown of head. She stated she has been scratching those areas. Pt has short hair. Stated her husband and daughter couldn't see anything. She wasn't able to get into her PCP. Advised pt if itching worsens and not improved by Monday to contact PCP or go to urgent care.
